# Patient Record
Sex: Male | Born: 1970
Health system: Southern US, Community
[De-identification: ages and names within clinical notes are randomized; demographics above are authoritative.]

## PROBLEM LIST (undated history)

## (undated) DIAGNOSIS — T7840XA Allergy, unspecified, initial encounter: Secondary | ICD-10-CM

## (undated) DIAGNOSIS — K219 Gastro-esophageal reflux disease without esophagitis: Secondary | ICD-10-CM

## (undated) DIAGNOSIS — J45909 Unspecified asthma, uncomplicated: Secondary | ICD-10-CM

## (undated) DIAGNOSIS — G43909 Migraine, unspecified, not intractable, without status migrainosus: Secondary | ICD-10-CM

## (undated) DIAGNOSIS — F419 Anxiety disorder, unspecified: Secondary | ICD-10-CM

## (undated) DIAGNOSIS — G4730 Sleep apnea, unspecified: Secondary | ICD-10-CM

## (undated) DIAGNOSIS — R519 Headache, unspecified: Secondary | ICD-10-CM

## (undated) HISTORY — DX: Allergy, unspecified, initial encounter: T78.40XA

## (undated) HISTORY — DX: Unspecified asthma, uncomplicated: J45.909

## (undated) HISTORY — DX: Gastro-esophageal reflux disease without esophagitis: K21.9

## (undated) HISTORY — DX: Headache, unspecified: R51.9

## (undated) HISTORY — DX: Anxiety disorder, unspecified: F41.9

## (undated) HISTORY — DX: Migraine, unspecified, not intractable, without status migrainosus: G43.909

## (undated) HISTORY — DX: Sleep apnea, unspecified: G47.30

---

## 1999-09-14 HISTORY — PX: WISDOM TOOTH EXTRACTION: SHX21

## 2001-09-13 HISTORY — PX: VEIN SURGERY: SHX48

## 2002-05-08 ENCOUNTER — Ambulatory Visit (HOSPITAL_COMMUNITY): Admission: RE | Admit: 2002-05-08 | Discharge: 2002-05-08 | Payer: Self-pay | Admitting: Internal Medicine

## 2002-12-17 ENCOUNTER — Encounter: Payer: Self-pay | Admitting: Internal Medicine

## 2002-12-17 ENCOUNTER — Encounter: Admission: RE | Admit: 2002-12-17 | Discharge: 2002-12-17 | Payer: Self-pay | Admitting: Internal Medicine

## 2004-06-18 ENCOUNTER — Encounter: Admission: RE | Admit: 2004-06-18 | Discharge: 2004-06-18 | Payer: Self-pay | Admitting: Diagnostic Radiology

## 2004-10-08 ENCOUNTER — Encounter: Admission: RE | Admit: 2004-10-08 | Discharge: 2004-10-08 | Payer: Self-pay | Admitting: Internal Medicine

## 2004-10-13 ENCOUNTER — Encounter: Admission: RE | Admit: 2004-10-13 | Discharge: 2004-10-13 | Payer: Self-pay | Admitting: Internal Medicine

## 2004-11-10 ENCOUNTER — Encounter: Admission: RE | Admit: 2004-11-10 | Discharge: 2004-11-10 | Payer: Self-pay | Admitting: Internal Medicine

## 2005-04-29 ENCOUNTER — Encounter: Admission: RE | Admit: 2005-04-29 | Discharge: 2005-04-29 | Payer: Self-pay | Admitting: Internal Medicine

## 2006-08-16 ENCOUNTER — Encounter: Admission: RE | Admit: 2006-08-16 | Discharge: 2006-08-16 | Payer: Self-pay | Admitting: Internal Medicine

## 2008-02-27 ENCOUNTER — Ambulatory Visit (HOSPITAL_COMMUNITY): Admission: RE | Admit: 2008-02-27 | Discharge: 2008-02-27 | Payer: Self-pay | Admitting: Internal Medicine

## 2010-09-13 HISTORY — PX: OTHER SURGICAL HISTORY: SHX169

## 2010-10-04 ENCOUNTER — Encounter: Payer: Self-pay | Admitting: Interventional Radiology

## 2010-12-25 ENCOUNTER — Ambulatory Visit: Payer: PRIVATE HEALTH INSURANCE | Attending: Specialist

## 2010-12-25 DIAGNOSIS — M25619 Stiffness of unspecified shoulder, not elsewhere classified: Secondary | ICD-10-CM | POA: Insufficient documentation

## 2010-12-25 DIAGNOSIS — M25519 Pain in unspecified shoulder: Secondary | ICD-10-CM | POA: Insufficient documentation

## 2010-12-25 DIAGNOSIS — IMO0001 Reserved for inherently not codable concepts without codable children: Secondary | ICD-10-CM | POA: Insufficient documentation

## 2010-12-25 DIAGNOSIS — R5381 Other malaise: Secondary | ICD-10-CM | POA: Insufficient documentation

## 2010-12-30 ENCOUNTER — Ambulatory Visit: Payer: PRIVATE HEALTH INSURANCE | Admitting: Physical Therapy

## 2010-12-31 ENCOUNTER — Ambulatory Visit: Payer: PRIVATE HEALTH INSURANCE | Admitting: Physical Therapy

## 2011-01-05 ENCOUNTER — Encounter: Payer: PRIVATE HEALTH INSURANCE | Admitting: Physical Therapy

## 2011-01-07 ENCOUNTER — Ambulatory Visit: Payer: PRIVATE HEALTH INSURANCE | Admitting: Physical Therapy

## 2011-01-11 ENCOUNTER — Ambulatory Visit: Payer: PRIVATE HEALTH INSURANCE | Admitting: Physical Therapy

## 2011-01-15 ENCOUNTER — Ambulatory Visit: Payer: PRIVATE HEALTH INSURANCE | Attending: Specialist

## 2011-01-15 DIAGNOSIS — R5381 Other malaise: Secondary | ICD-10-CM | POA: Insufficient documentation

## 2011-01-15 DIAGNOSIS — IMO0001 Reserved for inherently not codable concepts without codable children: Secondary | ICD-10-CM | POA: Insufficient documentation

## 2011-01-15 DIAGNOSIS — M25619 Stiffness of unspecified shoulder, not elsewhere classified: Secondary | ICD-10-CM | POA: Insufficient documentation

## 2011-01-15 DIAGNOSIS — M25519 Pain in unspecified shoulder: Secondary | ICD-10-CM | POA: Insufficient documentation

## 2011-01-21 ENCOUNTER — Ambulatory Visit: Payer: PRIVATE HEALTH INSURANCE

## 2011-02-02 ENCOUNTER — Ambulatory Visit: Payer: PRIVATE HEALTH INSURANCE

## 2011-02-09 ENCOUNTER — Ambulatory Visit: Payer: PRIVATE HEALTH INSURANCE

## 2011-02-15 ENCOUNTER — Ambulatory Visit: Payer: PRIVATE HEALTH INSURANCE | Attending: Specialist | Admitting: Physical Therapy

## 2011-02-15 DIAGNOSIS — IMO0001 Reserved for inherently not codable concepts without codable children: Secondary | ICD-10-CM | POA: Insufficient documentation

## 2011-02-15 DIAGNOSIS — M25619 Stiffness of unspecified shoulder, not elsewhere classified: Secondary | ICD-10-CM | POA: Insufficient documentation

## 2011-02-15 DIAGNOSIS — R5381 Other malaise: Secondary | ICD-10-CM | POA: Insufficient documentation

## 2011-02-15 DIAGNOSIS — M25519 Pain in unspecified shoulder: Secondary | ICD-10-CM | POA: Insufficient documentation

## 2011-02-23 ENCOUNTER — Ambulatory Visit: Payer: PRIVATE HEALTH INSURANCE

## 2011-03-05 ENCOUNTER — Ambulatory Visit: Payer: PRIVATE HEALTH INSURANCE

## 2011-03-18 ENCOUNTER — Ambulatory Visit: Payer: PRIVATE HEALTH INSURANCE | Attending: Specialist

## 2011-03-18 DIAGNOSIS — R5381 Other malaise: Secondary | ICD-10-CM | POA: Insufficient documentation

## 2011-03-18 DIAGNOSIS — IMO0001 Reserved for inherently not codable concepts without codable children: Secondary | ICD-10-CM | POA: Insufficient documentation

## 2011-03-18 DIAGNOSIS — M25619 Stiffness of unspecified shoulder, not elsewhere classified: Secondary | ICD-10-CM | POA: Insufficient documentation

## 2011-03-18 DIAGNOSIS — M25519 Pain in unspecified shoulder: Secondary | ICD-10-CM | POA: Insufficient documentation

## 2011-04-14 ENCOUNTER — Other Ambulatory Visit: Payer: Self-pay | Admitting: Dermatology

## 2011-05-21 ENCOUNTER — Ambulatory Visit: Payer: PRIVATE HEALTH INSURANCE

## 2011-05-27 ENCOUNTER — Ambulatory Visit: Payer: PRIVATE HEALTH INSURANCE | Attending: Specialist

## 2011-05-27 DIAGNOSIS — IMO0001 Reserved for inherently not codable concepts without codable children: Secondary | ICD-10-CM | POA: Insufficient documentation

## 2011-05-27 DIAGNOSIS — M25519 Pain in unspecified shoulder: Secondary | ICD-10-CM | POA: Insufficient documentation

## 2011-05-27 DIAGNOSIS — M25619 Stiffness of unspecified shoulder, not elsewhere classified: Secondary | ICD-10-CM | POA: Insufficient documentation

## 2011-05-27 DIAGNOSIS — R5381 Other malaise: Secondary | ICD-10-CM | POA: Insufficient documentation

## 2011-06-10 ENCOUNTER — Ambulatory Visit: Payer: PRIVATE HEALTH INSURANCE

## 2011-06-22 ENCOUNTER — Ambulatory Visit: Payer: BC Managed Care – PPO | Attending: Specialist

## 2011-06-22 DIAGNOSIS — R5381 Other malaise: Secondary | ICD-10-CM | POA: Insufficient documentation

## 2011-06-22 DIAGNOSIS — M25519 Pain in unspecified shoulder: Secondary | ICD-10-CM | POA: Insufficient documentation

## 2011-06-22 DIAGNOSIS — IMO0001 Reserved for inherently not codable concepts without codable children: Secondary | ICD-10-CM | POA: Insufficient documentation

## 2011-06-22 DIAGNOSIS — M25619 Stiffness of unspecified shoulder, not elsewhere classified: Secondary | ICD-10-CM | POA: Insufficient documentation

## 2011-07-09 ENCOUNTER — Ambulatory Visit: Payer: BC Managed Care – PPO

## 2011-07-30 ENCOUNTER — Ambulatory Visit: Payer: PRIVATE HEALTH INSURANCE | Attending: Specialist

## 2011-07-30 DIAGNOSIS — R5381 Other malaise: Secondary | ICD-10-CM | POA: Insufficient documentation

## 2011-07-30 DIAGNOSIS — IMO0001 Reserved for inherently not codable concepts without codable children: Secondary | ICD-10-CM | POA: Insufficient documentation

## 2011-07-30 DIAGNOSIS — M25619 Stiffness of unspecified shoulder, not elsewhere classified: Secondary | ICD-10-CM | POA: Insufficient documentation

## 2011-07-30 DIAGNOSIS — M25519 Pain in unspecified shoulder: Secondary | ICD-10-CM | POA: Insufficient documentation

## 2015-09-30 MED FILL — PANTOPRAZOLE SOD DR 40 MG T: 40 | 90 days supply | Qty: 90 | Fill #3

## 2015-09-30 MED FILL — METOPROLOL TARTRATE 25 MG T: 25 | 90 days supply | Qty: 180 | Fill #0

## 2015-10-28 MED FILL — CITALOPRAM HBR 40 MG TABLET: 40 | 90 days supply | Qty: 90 | Fill #2

## 2015-12-31 MED FILL — PANTOPRAZOLE SOD DR 40 MG T: 40 | 90 days supply | Qty: 90 | Fill #0

## 2016-02-04 MED FILL — CITALOPRAM HBR 40 MG TABLET: 40 | 90 days supply | Qty: 90 | Fill #3

## 2016-03-23 MED FILL — PANTOPRAZOLE SOD DR 40 MG T: 40 | 90 days supply | Qty: 90 | Fill #1

## 2016-03-23 MED FILL — METOPROLOL TARTRATE 25 MG T: 25 | 90 days supply | Qty: 180 | Fill #1

## 2016-03-25 DIAGNOSIS — H5213 Myopia, bilateral: Secondary | ICD-10-CM | POA: Diagnosis not present

## 2016-03-25 DIAGNOSIS — H21231 Degeneration of iris (pigmentary), right eye: Secondary | ICD-10-CM | POA: Diagnosis not present

## 2016-03-30 DIAGNOSIS — J3089 Other allergic rhinitis: Secondary | ICD-10-CM | POA: Diagnosis not present

## 2016-04-08 MED FILL — FLUTICASONE PROP 50 MCG SPR: 50 | 90 days supply | Qty: 48 | Fill #1

## 2016-04-27 MED FILL — AZITHROMYCIN 250 MG TABLET: 250 | 5 days supply | Qty: 6 | Fill #0

## 2016-05-06 MED FILL — CITALOPRAM HBR 40 MG TABLET: 40 | 90 days supply | Qty: 90 | Fill #0

## 2016-07-05 MED FILL — PANTOPRAZOLE SOD DR 40 MG T: 40 | 90 days supply | Qty: 90 | Fill #2

## 2016-07-05 MED FILL — FLUTICASONE PROP 50 MCG SPR: 50 | 90 days supply | Qty: 48 | Fill #2

## 2016-08-02 MED FILL — CITALOPRAM HBR 40 MG TABLET: 40 | 90 days supply | Qty: 90 | Fill #1

## 2016-08-12 DIAGNOSIS — E785 Hyperlipidemia, unspecified: Secondary | ICD-10-CM | POA: Diagnosis not present

## 2016-08-12 DIAGNOSIS — Z125 Encounter for screening for malignant neoplasm of prostate: Secondary | ICD-10-CM | POA: Diagnosis not present

## 2016-08-12 DIAGNOSIS — R7301 Impaired fasting glucose: Secondary | ICD-10-CM | POA: Diagnosis not present

## 2016-08-12 DIAGNOSIS — Z0000 Encounter for general adult medical examination without abnormal findings: Secondary | ICD-10-CM | POA: Diagnosis not present

## 2016-08-18 DIAGNOSIS — Z0000 Encounter for general adult medical examination without abnormal findings: Secondary | ICD-10-CM | POA: Diagnosis not present

## 2016-08-18 DIAGNOSIS — Z1389 Encounter for screening for other disorder: Secondary | ICD-10-CM | POA: Diagnosis not present

## 2016-08-18 DIAGNOSIS — Z6826 Body mass index (BMI) 26.0-26.9, adult: Secondary | ICD-10-CM | POA: Diagnosis not present

## 2016-08-18 DIAGNOSIS — J302 Other seasonal allergic rhinitis: Secondary | ICD-10-CM | POA: Diagnosis not present

## 2016-08-18 DIAGNOSIS — K219 Gastro-esophageal reflux disease without esophagitis: Secondary | ICD-10-CM | POA: Diagnosis not present

## 2016-08-18 DIAGNOSIS — J45998 Other asthma: Secondary | ICD-10-CM | POA: Diagnosis not present

## 2016-08-18 DIAGNOSIS — M25511 Pain in right shoulder: Secondary | ICD-10-CM | POA: Diagnosis not present

## 2016-08-18 MED FILL — ATORVASTATIN 10 MG TABLET: 10 | 90 days supply | Qty: 90 | Fill #0

## 2016-09-30 MED FILL — METOPROLOL TARTRATE 25 MG T: 25 | 90 days supply | Qty: 180 | Fill #0

## 2016-09-30 MED FILL — PANTOPRAZOLE SOD DR 40 MG T: 40 | 90 days supply | Qty: 90 | Fill #3

## 2016-10-04 MED FILL — AZITHROMYCIN 500 MG TABLET: 500 | 3 days supply | Qty: 3 | Fill #0

## 2016-10-18 DIAGNOSIS — J3089 Other allergic rhinitis: Secondary | ICD-10-CM | POA: Diagnosis not present

## 2016-11-09 MED FILL — CITALOPRAM HBR 40 MG TABLET: 40 | 90 days supply | Qty: 90 | Fill #2

## 2016-12-27 MED FILL — FLUTICASONE PROP 50 MCG SPR: 50 | 90 days supply | Qty: 48 | Fill #0

## 2017-01-28 MED FILL — NYSTATIN 100,000 UNITS/GM O: 100000 | 10 days supply | Qty: 15 | Fill #0

## 2017-02-11 MED FILL — CITALOPRAM HBR 40 MG TABLET: 40 | 90 days supply | Qty: 90 | Fill #3

## 2017-03-15 MED FILL — METOPROLOL TARTRATE 25 MG T: 25 | 90 days supply | Qty: 180 | Fill #1

## 2017-03-21 DIAGNOSIS — J3089 Other allergic rhinitis: Secondary | ICD-10-CM | POA: Diagnosis not present

## 2017-05-02 MED FILL — CITALOPRAM HBR 40 MG TABLET: 40 | 90 days supply | Qty: 90 | Fill #0

## 2017-05-13 MED FILL — FLUTICASONE PROP 50 MCG SPR: 50 | 90 days supply | Qty: 48 | Fill #1

## 2017-05-23 MED FILL — PANTOPRAZOLE SOD DR 40 MG T: 40 | 90 days supply | Qty: 90 | Fill #0

## 2017-08-15 DIAGNOSIS — J4530 Mild persistent asthma, uncomplicated: Secondary | ICD-10-CM | POA: Diagnosis not present

## 2017-08-15 DIAGNOSIS — J3089 Other allergic rhinitis: Secondary | ICD-10-CM | POA: Diagnosis not present

## 2017-08-15 MED FILL — FLUTICASONE PROP 50 MCG SPR: 50 | 90 days supply | Qty: 48 | Fill #0

## 2017-08-19 MED FILL — PANTOPRAZOLE SOD DR 40 MG T: 40 | 90 days supply | Qty: 90 | Fill #1

## 2017-08-19 MED FILL — CITALOPRAM HBR 40 MG TABLET: 40 | 90 days supply | Qty: 90 | Fill #1

## 2017-08-31 DIAGNOSIS — J3089 Other allergic rhinitis: Secondary | ICD-10-CM | POA: Diagnosis not present

## 2017-09-08 DIAGNOSIS — R05 Cough: Secondary | ICD-10-CM | POA: Diagnosis not present

## 2017-09-08 DIAGNOSIS — J209 Acute bronchitis, unspecified: Secondary | ICD-10-CM | POA: Diagnosis not present

## 2017-09-08 DIAGNOSIS — R509 Fever, unspecified: Secondary | ICD-10-CM | POA: Diagnosis not present

## 2017-09-08 DIAGNOSIS — J45909 Unspecified asthma, uncomplicated: Secondary | ICD-10-CM | POA: Diagnosis not present

## 2017-09-09 MED FILL — predniSONE 10 MG TABS: 10 | 6 days supply | Qty: 21 | Fill #0

## 2017-09-09 MED FILL — AZITHROMYCIN 250 MG TABLET: 250 | 5 days supply | Qty: 6 | Fill #0

## 2017-10-07 MED FILL — METOPROLOL TARTRATE 25 MG T: 25 | 90 days supply | Qty: 180 | Fill #0

## 2017-10-11 DIAGNOSIS — Z0000 Encounter for general adult medical examination without abnormal findings: Secondary | ICD-10-CM | POA: Diagnosis not present

## 2017-10-11 DIAGNOSIS — Z125 Encounter for screening for malignant neoplasm of prostate: Secondary | ICD-10-CM | POA: Diagnosis not present

## 2017-10-17 DIAGNOSIS — J45998 Other asthma: Secondary | ICD-10-CM | POA: Diagnosis not present

## 2017-10-17 DIAGNOSIS — Z1389 Encounter for screening for other disorder: Secondary | ICD-10-CM | POA: Diagnosis not present

## 2017-10-17 DIAGNOSIS — Z0000 Encounter for general adult medical examination without abnormal findings: Secondary | ICD-10-CM | POA: Diagnosis not present

## 2017-10-17 DIAGNOSIS — J302 Other seasonal allergic rhinitis: Secondary | ICD-10-CM | POA: Diagnosis not present

## 2017-10-17 DIAGNOSIS — M25519 Pain in unspecified shoulder: Secondary | ICD-10-CM | POA: Diagnosis not present

## 2017-10-17 DIAGNOSIS — Z1331 Encounter for screening for depression: Secondary | ICD-10-CM | POA: Diagnosis not present

## 2017-10-17 DIAGNOSIS — R0789 Other chest pain: Secondary | ICD-10-CM | POA: Diagnosis not present

## 2017-10-17 DIAGNOSIS — R7301 Impaired fasting glucose: Secondary | ICD-10-CM | POA: Diagnosis not present

## 2017-11-11 MED FILL — CITALOPRAM HBR 40 MG TABLET: 40 | 90 days supply | Qty: 90 | Fill #2

## 2017-11-11 MED FILL — PANTOPRAZOLE SOD DR 40 MG T: 40 | 90 days supply | Qty: 90 | Fill #2

## 2018-01-20 MED FILL — FLUTICASONE PROP 50 MCG SPR: 50 | 90 days supply | Qty: 48 | Fill #1

## 2018-02-21 MED FILL — CITALOPRAM HBR 40 MG TABLET: 40 | 90 days supply | Qty: 90 | Fill #3

## 2018-02-22 MED FILL — PANTOPRAZOLE SOD DR 40 MG T: 40 | 90 days supply | Qty: 90 | Fill #0

## 2018-04-05 MED FILL — METOPROLOL TARTRATE 25 MG T: 25 | 90 days supply | Qty: 180 | Fill #1

## 2018-04-20 DIAGNOSIS — M9901 Segmental and somatic dysfunction of cervical region: Secondary | ICD-10-CM | POA: Diagnosis not present

## 2018-04-20 DIAGNOSIS — M9902 Segmental and somatic dysfunction of thoracic region: Secondary | ICD-10-CM | POA: Diagnosis not present

## 2018-04-20 DIAGNOSIS — M47816 Spondylosis without myelopathy or radiculopathy, lumbar region: Secondary | ICD-10-CM | POA: Diagnosis not present

## 2018-04-20 DIAGNOSIS — M9903 Segmental and somatic dysfunction of lumbar region: Secondary | ICD-10-CM | POA: Diagnosis not present

## 2018-04-24 DIAGNOSIS — M9901 Segmental and somatic dysfunction of cervical region: Secondary | ICD-10-CM | POA: Diagnosis not present

## 2018-04-24 DIAGNOSIS — M9902 Segmental and somatic dysfunction of thoracic region: Secondary | ICD-10-CM | POA: Diagnosis not present

## 2018-04-24 DIAGNOSIS — M47816 Spondylosis without myelopathy or radiculopathy, lumbar region: Secondary | ICD-10-CM | POA: Diagnosis not present

## 2018-04-24 DIAGNOSIS — M9903 Segmental and somatic dysfunction of lumbar region: Secondary | ICD-10-CM | POA: Diagnosis not present

## 2018-04-27 DIAGNOSIS — M9903 Segmental and somatic dysfunction of lumbar region: Secondary | ICD-10-CM | POA: Diagnosis not present

## 2018-04-27 DIAGNOSIS — M9902 Segmental and somatic dysfunction of thoracic region: Secondary | ICD-10-CM | POA: Diagnosis not present

## 2018-04-27 DIAGNOSIS — M9901 Segmental and somatic dysfunction of cervical region: Secondary | ICD-10-CM | POA: Diagnosis not present

## 2018-04-27 DIAGNOSIS — M47816 Spondylosis without myelopathy or radiculopathy, lumbar region: Secondary | ICD-10-CM | POA: Diagnosis not present

## 2018-05-01 DIAGNOSIS — M47816 Spondylosis without myelopathy or radiculopathy, lumbar region: Secondary | ICD-10-CM | POA: Diagnosis not present

## 2018-05-01 DIAGNOSIS — M9903 Segmental and somatic dysfunction of lumbar region: Secondary | ICD-10-CM | POA: Diagnosis not present

## 2018-05-01 DIAGNOSIS — M9902 Segmental and somatic dysfunction of thoracic region: Secondary | ICD-10-CM | POA: Diagnosis not present

## 2018-05-01 DIAGNOSIS — M9901 Segmental and somatic dysfunction of cervical region: Secondary | ICD-10-CM | POA: Diagnosis not present

## 2018-05-04 DIAGNOSIS — M9901 Segmental and somatic dysfunction of cervical region: Secondary | ICD-10-CM | POA: Diagnosis not present

## 2018-05-04 DIAGNOSIS — M47816 Spondylosis without myelopathy or radiculopathy, lumbar region: Secondary | ICD-10-CM | POA: Diagnosis not present

## 2018-05-04 DIAGNOSIS — M9902 Segmental and somatic dysfunction of thoracic region: Secondary | ICD-10-CM | POA: Diagnosis not present

## 2018-05-04 DIAGNOSIS — M9903 Segmental and somatic dysfunction of lumbar region: Secondary | ICD-10-CM | POA: Diagnosis not present

## 2018-05-05 MED FILL — FLUTICASONE PROP 50 MCG SPR: 50 | 90 days supply | Qty: 48 | Fill #2

## 2018-05-09 DIAGNOSIS — M9903 Segmental and somatic dysfunction of lumbar region: Secondary | ICD-10-CM | POA: Diagnosis not present

## 2018-05-09 DIAGNOSIS — M9901 Segmental and somatic dysfunction of cervical region: Secondary | ICD-10-CM | POA: Diagnosis not present

## 2018-05-09 DIAGNOSIS — M9902 Segmental and somatic dysfunction of thoracic region: Secondary | ICD-10-CM | POA: Diagnosis not present

## 2018-05-09 DIAGNOSIS — M47816 Spondylosis without myelopathy or radiculopathy, lumbar region: Secondary | ICD-10-CM | POA: Diagnosis not present

## 2018-05-10 DIAGNOSIS — M9902 Segmental and somatic dysfunction of thoracic region: Secondary | ICD-10-CM | POA: Diagnosis not present

## 2018-05-10 DIAGNOSIS — M9901 Segmental and somatic dysfunction of cervical region: Secondary | ICD-10-CM | POA: Diagnosis not present

## 2018-05-10 DIAGNOSIS — M47816 Spondylosis without myelopathy or radiculopathy, lumbar region: Secondary | ICD-10-CM | POA: Diagnosis not present

## 2018-05-10 DIAGNOSIS — M9903 Segmental and somatic dysfunction of lumbar region: Secondary | ICD-10-CM | POA: Diagnosis not present

## 2018-05-11 DIAGNOSIS — J3089 Other allergic rhinitis: Secondary | ICD-10-CM | POA: Diagnosis not present

## 2018-05-16 DIAGNOSIS — M47816 Spondylosis without myelopathy or radiculopathy, lumbar region: Secondary | ICD-10-CM | POA: Diagnosis not present

## 2018-05-16 DIAGNOSIS — M9902 Segmental and somatic dysfunction of thoracic region: Secondary | ICD-10-CM | POA: Diagnosis not present

## 2018-05-16 DIAGNOSIS — M9903 Segmental and somatic dysfunction of lumbar region: Secondary | ICD-10-CM | POA: Diagnosis not present

## 2018-05-16 DIAGNOSIS — M9901 Segmental and somatic dysfunction of cervical region: Secondary | ICD-10-CM | POA: Diagnosis not present

## 2018-05-18 DIAGNOSIS — M47816 Spondylosis without myelopathy or radiculopathy, lumbar region: Secondary | ICD-10-CM | POA: Diagnosis not present

## 2018-05-18 DIAGNOSIS — M9903 Segmental and somatic dysfunction of lumbar region: Secondary | ICD-10-CM | POA: Diagnosis not present

## 2018-05-18 DIAGNOSIS — M9902 Segmental and somatic dysfunction of thoracic region: Secondary | ICD-10-CM | POA: Diagnosis not present

## 2018-05-18 DIAGNOSIS — M9901 Segmental and somatic dysfunction of cervical region: Secondary | ICD-10-CM | POA: Diagnosis not present

## 2018-05-22 DIAGNOSIS — M9901 Segmental and somatic dysfunction of cervical region: Secondary | ICD-10-CM | POA: Diagnosis not present

## 2018-05-22 DIAGNOSIS — M9903 Segmental and somatic dysfunction of lumbar region: Secondary | ICD-10-CM | POA: Diagnosis not present

## 2018-05-22 DIAGNOSIS — M9902 Segmental and somatic dysfunction of thoracic region: Secondary | ICD-10-CM | POA: Diagnosis not present

## 2018-05-22 DIAGNOSIS — M47816 Spondylosis without myelopathy or radiculopathy, lumbar region: Secondary | ICD-10-CM | POA: Diagnosis not present

## 2018-05-25 DIAGNOSIS — M9902 Segmental and somatic dysfunction of thoracic region: Secondary | ICD-10-CM | POA: Diagnosis not present

## 2018-05-25 DIAGNOSIS — M9903 Segmental and somatic dysfunction of lumbar region: Secondary | ICD-10-CM | POA: Diagnosis not present

## 2018-05-25 DIAGNOSIS — M9901 Segmental and somatic dysfunction of cervical region: Secondary | ICD-10-CM | POA: Diagnosis not present

## 2018-05-25 DIAGNOSIS — M47816 Spondylosis without myelopathy or radiculopathy, lumbar region: Secondary | ICD-10-CM | POA: Diagnosis not present

## 2018-05-26 MED FILL — PANTOPRAZOLE SOD DR 40 MG T: 40 | 90 days supply | Qty: 90 | Fill #1

## 2018-05-29 DIAGNOSIS — M9903 Segmental and somatic dysfunction of lumbar region: Secondary | ICD-10-CM | POA: Diagnosis not present

## 2018-05-29 DIAGNOSIS — M9901 Segmental and somatic dysfunction of cervical region: Secondary | ICD-10-CM | POA: Diagnosis not present

## 2018-05-29 DIAGNOSIS — M47816 Spondylosis without myelopathy or radiculopathy, lumbar region: Secondary | ICD-10-CM | POA: Diagnosis not present

## 2018-05-29 DIAGNOSIS — M9902 Segmental and somatic dysfunction of thoracic region: Secondary | ICD-10-CM | POA: Diagnosis not present

## 2018-05-29 MED FILL — CITALOPRAM HBR 40 MG TABLET: 40 | 90 days supply | Qty: 90 | Fill #0

## 2018-05-30 DIAGNOSIS — M47816 Spondylosis without myelopathy or radiculopathy, lumbar region: Secondary | ICD-10-CM | POA: Diagnosis not present

## 2018-05-30 DIAGNOSIS — M9902 Segmental and somatic dysfunction of thoracic region: Secondary | ICD-10-CM | POA: Diagnosis not present

## 2018-05-30 DIAGNOSIS — M9901 Segmental and somatic dysfunction of cervical region: Secondary | ICD-10-CM | POA: Diagnosis not present

## 2018-05-30 DIAGNOSIS — M9903 Segmental and somatic dysfunction of lumbar region: Secondary | ICD-10-CM | POA: Diagnosis not present

## 2018-06-05 DIAGNOSIS — M9902 Segmental and somatic dysfunction of thoracic region: Secondary | ICD-10-CM | POA: Diagnosis not present

## 2018-06-05 DIAGNOSIS — M9901 Segmental and somatic dysfunction of cervical region: Secondary | ICD-10-CM | POA: Diagnosis not present

## 2018-06-05 DIAGNOSIS — M47816 Spondylosis without myelopathy or radiculopathy, lumbar region: Secondary | ICD-10-CM | POA: Diagnosis not present

## 2018-06-05 DIAGNOSIS — M9903 Segmental and somatic dysfunction of lumbar region: Secondary | ICD-10-CM | POA: Diagnosis not present

## 2018-08-21 DIAGNOSIS — J4530 Mild persistent asthma, uncomplicated: Secondary | ICD-10-CM | POA: Diagnosis not present

## 2018-08-21 DIAGNOSIS — J3089 Other allergic rhinitis: Secondary | ICD-10-CM | POA: Diagnosis not present

## 2018-09-01 MED FILL — FLUTICASONE PROP 50 MCG SPR: 50 | 30 days supply | Qty: 16 | Fill #0

## 2018-09-01 MED FILL — PANTOPRAZOLE SOD DR 40 MG T: 40 | 30 days supply | Qty: 30 | Fill #2

## 2018-09-01 MED FILL — CITALOPRAM HBR 40 MG TABLET: 40 | 30 days supply | Qty: 30 | Fill #1

## 2018-10-04 MED FILL — CITALOPRAM HBR 40 MG TABLET: 40 | 30 days supply | Qty: 30 | Fill #2

## 2018-10-04 MED FILL — FLUTICASONE PROP 50 MCG SPR: 50 | 30 days supply | Qty: 16 | Fill #1

## 2018-10-04 MED FILL — PANTOPRAZOLE SOD DR 40 MG T: 40 | 30 days supply | Qty: 30 | Fill #3

## 2018-10-23 MED FILL — METOPROLOL TARTRATE 25 MG T: 25 | 30 days supply | Qty: 60 | Fill #0

## 2018-11-03 MED FILL — CITALOPRAM HBR 40 MG TABLET: 40 | 30 days supply | Qty: 30 | Fill #3

## 2018-11-03 MED FILL — PANTOPRAZOLE SOD DR 40 MG T: 40 | 30 days supply | Qty: 30 | Fill #4

## 2018-11-06 MED FILL — FLUTICASONE PROP 50 MCG SPR: 50 | 30 days supply | Qty: 16 | Fill #2

## 2018-11-20 DIAGNOSIS — E785 Hyperlipidemia, unspecified: Secondary | ICD-10-CM | POA: Diagnosis not present

## 2018-11-20 DIAGNOSIS — R82998 Other abnormal findings in urine: Secondary | ICD-10-CM | POA: Diagnosis not present

## 2018-11-20 DIAGNOSIS — Z0000 Encounter for general adult medical examination without abnormal findings: Secondary | ICD-10-CM | POA: Diagnosis not present

## 2018-11-27 DIAGNOSIS — J45998 Other asthma: Secondary | ICD-10-CM | POA: Diagnosis not present

## 2018-11-27 DIAGNOSIS — Z0000 Encounter for general adult medical examination without abnormal findings: Secondary | ICD-10-CM | POA: Diagnosis not present

## 2018-11-27 DIAGNOSIS — H6123 Impacted cerumen, bilateral: Secondary | ICD-10-CM | POA: Diagnosis not present

## 2018-11-27 DIAGNOSIS — M25519 Pain in unspecified shoulder: Secondary | ICD-10-CM | POA: Diagnosis not present

## 2018-11-27 DIAGNOSIS — J3089 Other allergic rhinitis: Secondary | ICD-10-CM | POA: Diagnosis not present

## 2018-11-27 MED FILL — METOPROLOL TARTRATE 25 MG T: 25 | 30 days supply | Qty: 60 | Fill #1 | Status: TO

## 2018-11-27 MED FILL — PANTOPRAZOLE SOD DR 40 MG T: 40 | 30 days supply | Qty: 30 | Fill #0 | Status: TO

## 2018-11-27 MED FILL — CITALOPRAM HBR 40 MG TABLET: 40 | 30 days supply | Qty: 30 | Fill #4 | Status: TO

## 2018-11-29 MED FILL — VENTOLIN HFA 90 MCG INHALER: 108 (90 BAS | 31 days supply | Qty: 18 | Fill #0

## 2018-11-29 MED FILL — FLUTICASONE PROP 50 MCG SPR: 50 | 30 days supply | Qty: 16 | Fill #3

## 2018-12-25 MED FILL — METOPROLOL TARTRATE 25 MG T: 25 | 30 days supply | Qty: 60 | Fill #0

## 2018-12-25 MED FILL — CITALOPRAM HBR 40 MG TABLET: 40 | 30 days supply | Qty: 30 | Fill #0

## 2018-12-25 MED FILL — PANTOPRAZOLE SOD DR 40 MG T: 40 | 30 days supply | Qty: 30 | Fill #0

## 2019-01-29 MED FILL — CITALOPRAM HBR 40 MG TABLET: 40 | 30 days supply | Qty: 30 | Fill #1

## 2019-01-29 MED FILL — PANTOPRAZOLE SOD DR 40 MG T: 40 | 30 days supply | Qty: 30 | Fill #1

## 2019-01-29 MED FILL — METOPROLOL TARTRATE 25 MG T: 25 | 30 days supply | Qty: 60 | Fill #1

## 2019-02-15 MED FILL — FLUTICASONE PROP 50 MCG SPR: 50 | 30 days supply | Qty: 16 | Fill #4

## 2019-03-02 MED FILL — PANTOPRAZOLE SOD DR 40 MG T: 40 | 30 days supply | Qty: 30 | Fill #0

## 2019-03-02 MED FILL — CITALOPRAM HBR 40 MG TABLET: 40 | 30 days supply | Qty: 30 | Fill #0

## 2019-03-02 MED FILL — METOPROLOL TARTRATE 25 MG T: 25 | 30 days supply | Qty: 60 | Fill #0

## 2019-04-02 MED FILL — CITALOPRAM HBR 40 MG TABLET: 40 | 30 days supply | Qty: 30 | Fill #1

## 2019-04-02 MED FILL — PANTOPRAZOLE SOD DR 40 MG T: 40 | 30 days supply | Qty: 30 | Fill #1

## 2019-04-02 MED FILL — METOPROLOL TARTRATE 25 MG T: 25 | 30 days supply | Qty: 60 | Fill #1

## 2019-05-10 MED FILL — CITALOPRAM HBR 40 MG TABLET: 40 | 30 days supply | Qty: 30 | Fill #2

## 2019-05-10 MED FILL — PANTOPRAZOLE SOD DR 40 MG T: 40 | 30 days supply | Qty: 30 | Fill #2

## 2019-06-13 MED FILL — PANTOPRAZOLE SOD DR 40 MG T: 40 | 30 days supply | Qty: 30 | Fill #3

## 2019-06-13 MED FILL — CITALOPRAM HBR 40 MG TABLET: 40 | 30 days supply | Qty: 30 | Fill #0

## 2019-07-09 MED FILL — PANTOPRAZOLE SOD DR 40 MG T: 40 | 30 days supply | Qty: 30 | Fill #4

## 2019-07-09 MED FILL — CITALOPRAM HBR 40 MG TABLET: 40 | 30 days supply | Qty: 30 | Fill #1

## 2019-08-08 MED FILL — PANTOPRAZOLE SOD DR 40 MG T: 40 | 30 days supply | Qty: 30 | Fill #5

## 2019-08-08 MED FILL — CITALOPRAM HBR 40 MG TABLET: 40 | 30 days supply | Qty: 30 | Fill #2

## 2019-08-29 DIAGNOSIS — J3089 Other allergic rhinitis: Secondary | ICD-10-CM | POA: Diagnosis not present

## 2019-08-29 DIAGNOSIS — J4530 Mild persistent asthma, uncomplicated: Secondary | ICD-10-CM | POA: Diagnosis not present

## 2019-08-29 DIAGNOSIS — H1045 Other chronic allergic conjunctivitis: Secondary | ICD-10-CM | POA: Diagnosis not present

## 2019-09-03 DIAGNOSIS — J3089 Other allergic rhinitis: Secondary | ICD-10-CM | POA: Diagnosis not present

## 2019-09-10 MED FILL — CITALOPRAM HBR 40 MG TABLET: 40 | 30 days supply | Qty: 30 | Fill #3

## 2019-09-10 MED FILL — PANTOPRAZOLE SOD DR 40 MG T: 40 | 30 days supply | Qty: 30 | Fill #6

## 2019-10-09 DIAGNOSIS — Z7689 Persons encountering health services in other specified circumstances: Secondary | ICD-10-CM | POA: Diagnosis not present

## 2019-10-10 MED FILL — CITALOPRAM HBR 40 MG TABLET: 40 | 30 days supply | Qty: 30 | Fill #4

## 2019-10-23 MED FILL — PANTOPRAZOLE SOD DR 40 MG T: 40 | 30 days supply | Qty: 30 | Fill #7

## 2019-10-25 MED FILL — METOPROLOL TARTRATE 25 MG T: 25 | 90 days supply | Qty: 180 | Fill #0

## 2019-11-09 MED FILL — CITALOPRAM HBR 40 MG TABLET: 40 | 30 days supply | Qty: 30 | Fill #5

## 2019-11-26 MED FILL — PANTOPRAZOLE SOD DR 40 MG T: 40 | 30 days supply | Qty: 30 | Fill #8

## 2019-12-11 MED FILL — CITALOPRAM HBR 40 MG TABLET: 40 | 30 days supply | Qty: 30 | Fill #6

## 2019-12-17 DIAGNOSIS — M47816 Spondylosis without myelopathy or radiculopathy, lumbar region: Secondary | ICD-10-CM | POA: Diagnosis not present

## 2019-12-17 DIAGNOSIS — M9903 Segmental and somatic dysfunction of lumbar region: Secondary | ICD-10-CM | POA: Diagnosis not present

## 2019-12-18 DIAGNOSIS — M47816 Spondylosis without myelopathy or radiculopathy, lumbar region: Secondary | ICD-10-CM | POA: Diagnosis not present

## 2019-12-18 DIAGNOSIS — M9903 Segmental and somatic dysfunction of lumbar region: Secondary | ICD-10-CM | POA: Diagnosis not present

## 2019-12-20 ENCOUNTER — Encounter: Payer: Self-pay | Admitting: Neurology

## 2019-12-24 ENCOUNTER — Encounter: Payer: Self-pay | Admitting: Neurology

## 2019-12-24 ENCOUNTER — Other Ambulatory Visit (HOSPITAL_COMMUNITY): Payer: Self-pay | Admitting: Internal Medicine

## 2019-12-24 ENCOUNTER — Ambulatory Visit (INDEPENDENT_AMBULATORY_CARE_PROVIDER_SITE_OTHER): Payer: BC Managed Care – PPO | Admitting: Neurology

## 2019-12-24 ENCOUNTER — Other Ambulatory Visit: Payer: Self-pay

## 2019-12-24 VITALS — BP 122/79 | HR 62 | Temp 97.1°F | Ht 74.0 in | Wt 204.0 lb

## 2019-12-24 DIAGNOSIS — Z87820 Personal history of traumatic brain injury: Secondary | ICD-10-CM | POA: Insufficient documentation

## 2019-12-24 DIAGNOSIS — G2581 Restless legs syndrome: Secondary | ICD-10-CM

## 2019-12-24 DIAGNOSIS — M47816 Spondylosis without myelopathy or radiculopathy, lumbar region: Secondary | ICD-10-CM | POA: Diagnosis not present

## 2019-12-24 DIAGNOSIS — G4730 Sleep apnea, unspecified: Secondary | ICD-10-CM

## 2019-12-24 DIAGNOSIS — G4710 Hypersomnia, unspecified: Secondary | ICD-10-CM

## 2019-12-24 DIAGNOSIS — M9903 Segmental and somatic dysfunction of lumbar region: Secondary | ICD-10-CM | POA: Diagnosis not present

## 2019-12-24 DIAGNOSIS — R0683 Snoring: Secondary | ICD-10-CM

## 2019-12-24 MED FILL — PANTOPRAZOLE SOD DR 40 MG T: 40 | 90 days supply | Qty: 90 | Fill #0

## 2019-12-24 NOTE — Patient Instructions (Signed)

## 2019-12-24 NOTE — Progress Notes (Signed)
SLEEP MEDICINE CLINIC    Provider:  Larey Seat, MD  Primary Care Physician:  [name], MD [place] [address]     Referring Provider: [name], [place],  [place] [address]          Chief Complaint according to patient   Patient presents with:    . 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 Chief concern according to patient : I just don't sleep enough and I snore.    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  He  has a past medical history of Allergies, Anxiety, Asthma, GERD (gastroesophageal reflux disease), Headache, and Migraine. Asthma is an allergic manifestation for him. GERD is an active problem.    Sleep relevant medical history: wisdom teeth removed.Family medical /sleep history: Mother and brother used CPAP for OSA. Social history: Patient is working as a Herbalist, [employer].  and lives in a household with wife and 2 children, son is freshman in college, daughter is 10.  The patient currently works and takes call as MD. Pets are present- one dog. Tobacco use: none .  ETOH use; 2-3 times a week,  Caffeine intake in form of Coffee( 1 in AM  Soda( none ) Tea ( none) nor energy drinks. Regular exercise in form of walking 3 miles a day, and playing tennis.     Sleep habits are as follows: The patient's dinner time is variable due to kid's sports, usually between 7-8 PM. The patient goes to bed at 11 PM and is asleep promptly , continues to sleep for several hours, wakes for one  bathroom break, but mostly for snoring and apnea- wife will wake him and tell him that he didn't breathe. The preferred sleep position is prone, with the support of 1-2 pillows.  Had a right shoulder injury and sleeps avoiding the right  shoulder. Dreams are reportedly frequent/vivid.  6.30- 7  AM is the usual rise time. The patient wakes up with an alarm. On weekends , he sleeps until 9 AM and he used to be able to sleep 12 hours.  He reports not feeling refreshed or restored in AM, with symptoms such as dry mouth , morning headaches sometimes, and residual fatigue.  Naps are taken on weekends frequently after lunch , lasting from 30-90 minutes and are more refreshing than nocturnal sleep.    Review of Systems: Out of a complete 14 system review, the patient complains of only the following symptoms, and all other reviewed systems are negative.:  Fatigue, sleepiness , snoring, fragmented sleep, Insomnia- when snoring.   How likely are you to doze in the following situations: 0 = not likely, 1 = slight chance, 2 = moderate chance, 3 = high chance   Sitting and Reading? 2 Watching Television? 1 Sitting inactive in a public place (theater or meeting)? 1 As a passenger in a car for an hour without a break? 2 Lying down in the afternoon when circumstances permit? 2 Sitting and talking to someone?0 Sitting quietly after lunch without alcohol? 3 In a car, while stopped for a few minutes in traffic?   Total = 11/ 24 points   FSS endorsed at 27/  63 points.   Social History   Socioeconomic History  . Marital status: Married    Spouse name: Not on file  . Number of children: 2  . Years of education: college  . Highest education level: Professional school degree (e.g., MD, DDS, DVM, JD)  Occupational History  . 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 Smoking status: Never Smoker  . Smokeless tobacco: Never Used  Substance and Sexual Activity  . Alcohol use: Yes    Comment: 1-2 drinks twice weekly  . Drug use: Never  . Sexual activity: Not on file  Other Topics Concern  . Not on file  Social History Narrative   Lives at home with wife and family.   Right-handed.   1 cup caffeine per day.   Social  Determinants of Health   Financial Resource Strain:   . Difficulty of Paying Living Expenses:   Food Insecurity:   . Worried About Charity fundraiser in the Last Year:   . Arboriculturist in the Last Year:   Transportation Needs:   . Film/video editor (Medical):   Marland Kitchen Lack of Transportation (Non-Medical):   Physical Activity:   . Days of Exercise per Week:   . Minutes of Exercise per Session:   Stress:   . Feeling of Stress :   Social Connections:   . Frequency of Communication with Friends and Family:   . Frequency of Social Gatherings with Friends and Family:   . Attends Religious Services:   . Active Member of Clubs or Organizations:   . Attends Archivist Meetings:   Marland Kitchen Marital Status:     Family History  Problem Relation Age of Onset  . Diabetes Mother   . Other Mother        Trigeminal Neuralgia, Meningioma (removal 1990s)  . Hypertension Mother   . Migraines Mother   . Sleep apnea Mother   . Crohn's disease Mother   . Asthma Mother   . Heart disease Father   . Asthma Brother   . Sleep apnea Brother   . Heart disease Paternal Grandfather   . Prostate cancer Paternal Grandfather     Past Medical History:  Diagnosis Date  . Allergies   . Anxiety   . Asthma   . GERD (gastroesophageal reflux disease)   . Headache   . Migraine     Past Surgical History:  Procedure Laterality Date  . shoulder surgery  2012  . VEIN SURGERY  2003  . WISDOM TOOTH EXTRACTION  2001     Current Outpatient Medications on File Prior to Visit  Medication Sig Dispense Refill  . citalopram (CELEXA) 40 MG tablet Take 40 mg by mouth daily.    . fluticasone (FLONASE) 50 MCG/ACT nasal spray Place 1 spray into both nostrils daily.    Marland Kitchen loratadine (CLARITIN) 10 MG tablet Take 10 mg by mouth daily.    Marland Kitchen MAGNESIUM PO Take 500 mg by mouth daily.    . metoprolol tartrate (LOPRESSOR) 25 MG tablet Take 25 mg by mouth 2 (two) times daily.    . Multiple Vitamin (MULTIVITAMIN)  tablet Take 1 tablet by mouth daily.    . pantoprazole (PROTONIX) 40 MG tablet Take 40 mg by mouth daily.    Marland Kitchen VITAMIN D PO Take 2,000 Units by mouth daily.     No current facility-administered medications on file prior to visit.    Allergies  Allergen Reactions  . Amoxicillin Hives  .  Cephalosporins Hives    Physical exam:  Today's Vitals   12/24/19 0944  BP: 122/79  Pulse: 62  Temp: (!) 97.1 F (36.2 C)  Weight: 204 lb (92.5 kg)  Height: 6\' 2"  (1.88 m)   Body mass index is 26.19 kg/m.   Wt Readings from Last 3 Encounters:  12/24/19 204 lb (92.5 kg)     Ht Readings from Last 3 Encounters:  12/24/19 6\' 2"  (1.88 m)      General: The patient is awake, alert and appears not in acute distress. The patient is well groomed. Head: Normocephalic, atraumatic. Neck is supple. Mallampati 1, red soft palate.  neck circumference: 16 inches . Nasal airflow patent.  Retrognathia is  seen.  Dental status: intact . Intact- Lower jaw.  Cardiovascular:  Regular rate and cardiac rhythm by pulse,  without distended neck veins. Respiratory: Lungs are clear to auscultation.  Skin:  Without evidence of ankle edema, or rash. Trunk: The patient's posture is erect.   Neurologic exam : The patient is awake and alert, oriented to place and time.   Memory subjective described as intact.  Attention span & concentration ability appears normal.  Speech is fluent,  without  dysarthria, dysphonia or aphasia.  Mood and affect are appropriate.   Cranial nerves: no loss of smell or taste reported - vaccinated both parts.  Pupils are equal and briskly reactive to light. Funduscopic exam deferred.  Wears glasses.  Extraocular movements in vertical and horizontal planes were intact and with horizontal endpoint  nystagmus. No Diplopia. Visual fields by finger perimetry are intact. Hearing was intact to soft voice and finger rubbing.   Facial sensation intact to fine touch. Facial motor strength is  symmetric and tongue and uvula move midline.  Neck ROM : rotation, tilt and flexion extension were normal for age and shoulder shrug was symmetrical.    Motor exam:  Symmetric bulk, tone and ROM.   Normal tone without cog wheeling, symmetric grip strength . Sensory:  Fine touch and vibration were  normal.  Proprioception tested in the upper extremities was normal. Coordination: Rapid alternating movements in the fingers/hands were of normal speed.  The Finger-to-nose maneuver was intact without evidence of ataxia, dysmetria or tremor. Gait and station: Patient could rise unassisted from a seated position, walked without assistive device.  Toe and heel walk were deferred.  Deep tendon reflexes: in the  upper and lower extremities are symmetric and intact.  Babinski response was deferred.    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  2)  Hypersomnia, mild  3)  Daytime sleepiness, non restorative sleep, need for naps.  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   My Plan is to proceed with:  1) either a HST or an attended PSG, both will work for OSA screening.  2) RLS/ PLMs would require an attended sleep study.    I would like to thank  [name], [address] [place],  [place] [address] for allowing me to meet with and to take care of this pleasant patient.   CC: I will share my  notes with PCP and see Dr [name] in 2-3 month. .  Electronically signed by: Larey Seat, MD 12/24/2019 10:16 AM  [facility] Board certified by The [employer] of Sleep Medicine and  Diplomate of the [employer] of Sleep Medicine. Board certified In Neurology through the [place], Fellow of the [employer] of Neurology. Medical Director of [facility].

## 2019-12-25 ENCOUNTER — Other Ambulatory Visit: Payer: Self-pay | Admitting: Neurology

## 2019-12-25 DIAGNOSIS — G2581 Restless legs syndrome: Secondary | ICD-10-CM

## 2019-12-25 DIAGNOSIS — R0683 Snoring: Secondary | ICD-10-CM

## 2019-12-25 DIAGNOSIS — G4710 Hypersomnia, unspecified: Secondary | ICD-10-CM

## 2019-12-31 DIAGNOSIS — M9903 Segmental and somatic dysfunction of lumbar region: Secondary | ICD-10-CM | POA: Diagnosis not present

## 2019-12-31 DIAGNOSIS — M47816 Spondylosis without myelopathy or radiculopathy, lumbar region: Secondary | ICD-10-CM | POA: Diagnosis not present

## 2020-01-07 DIAGNOSIS — M47816 Spondylosis without myelopathy or radiculopathy, lumbar region: Secondary | ICD-10-CM | POA: Diagnosis not present

## 2020-01-07 DIAGNOSIS — M9903 Segmental and somatic dysfunction of lumbar region: Secondary | ICD-10-CM | POA: Diagnosis not present

## 2020-01-09 ENCOUNTER — Telehealth: Payer: Self-pay

## 2020-01-09 DIAGNOSIS — M47816 Spondylosis without myelopathy or radiculopathy, lumbar region: Secondary | ICD-10-CM | POA: Diagnosis not present

## 2020-01-09 DIAGNOSIS — M9903 Segmental and somatic dysfunction of lumbar region: Secondary | ICD-10-CM | POA: Diagnosis not present

## 2020-01-09 NOTE — Telephone Encounter (Signed)
LVM for pt to call me back to schedule sleep study  

## 2020-01-11 MED FILL — CITALOPRAM HBR 40 MG TABLET: 40 | 30 days supply | Qty: 30 | Fill #7

## 2020-01-15 DIAGNOSIS — M47816 Spondylosis without myelopathy or radiculopathy, lumbar region: Secondary | ICD-10-CM | POA: Diagnosis not present

## 2020-01-15 DIAGNOSIS — M9903 Segmental and somatic dysfunction of lumbar region: Secondary | ICD-10-CM | POA: Diagnosis not present

## 2020-01-22 DIAGNOSIS — M9903 Segmental and somatic dysfunction of lumbar region: Secondary | ICD-10-CM | POA: Diagnosis not present

## 2020-01-22 DIAGNOSIS — M47816 Spondylosis without myelopathy or radiculopathy, lumbar region: Secondary | ICD-10-CM | POA: Diagnosis not present

## 2020-01-29 DIAGNOSIS — M9903 Segmental and somatic dysfunction of lumbar region: Secondary | ICD-10-CM | POA: Diagnosis not present

## 2020-01-29 DIAGNOSIS — M47816 Spondylosis without myelopathy or radiculopathy, lumbar region: Secondary | ICD-10-CM | POA: Diagnosis not present

## 2020-01-30 ENCOUNTER — Encounter: Payer: Self-pay | Admitting: Internal Medicine

## 2020-01-30 ENCOUNTER — Ambulatory Visit (INDEPENDENT_AMBULATORY_CARE_PROVIDER_SITE_OTHER): Payer: BC Managed Care – PPO | Admitting: Neurology

## 2020-01-30 DIAGNOSIS — G4710 Hypersomnia, unspecified: Secondary | ICD-10-CM

## 2020-01-30 DIAGNOSIS — R0683 Snoring: Secondary | ICD-10-CM

## 2020-01-30 DIAGNOSIS — G2581 Restless legs syndrome: Secondary | ICD-10-CM

## 2020-02-04 DIAGNOSIS — M9903 Segmental and somatic dysfunction of lumbar region: Secondary | ICD-10-CM | POA: Diagnosis not present

## 2020-02-04 DIAGNOSIS — M47816 Spondylosis without myelopathy or radiculopathy, lumbar region: Secondary | ICD-10-CM | POA: Diagnosis not present

## 2020-02-07 NOTE — Progress Notes (Signed)
Summary & Diagnosis of Dr. Hazle Quant Home Sleep test:  There is mild OSA present at an AHI of 6.8/h with some REM sleep related exacerbation to an AHI of 8/h. Most significant was the lack of  frequent movement artefact, indicating a calm, restful sleep -  And that the degree of apnea was very much dependent on supine sleep position. No  evidence of unusual cardiac rate variability, nor of hypoxia.  Treatment number one is to avoid supine sleep - if possible.  Snoring is likely present ( see RDI) and may respond also to  positional changes , to weight loss, and to a dental device- CPAP  can be entertained as well.  This mild degree of apnea can be treated by a dental device or  CPAP, but I consider CPAP optional.   I am happy to discuss these options with the patient.

## 2020-02-07 NOTE — Procedures (Signed)
  Patient Information     First Name: Benjamin Yopp. 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 He has a medical history of Allergies, Anxiety, Asthma, GERD (gastroesophageal reflux disease), Headache, and Migraine. Asthma is of allergic manifestation for him. GERD is an active problem. Wife reports snoring. He reports non- restorative sleep. Question of Sleep Apnea.       Summary & Diagnosis:    There is mild OSA present at an AHI of 6.8/h and with some REM exacerbation to an AHI of 8/h. Most significant was the lack of frequent movement artefact, indicating a calm, restful sleep - but apnea was very much dependent on supine sleep position. No evidence of unusual cardiac rate variability, nor of hypoxia.   Recommendations:      Treatment number one is to avoid supine sleep - if possible.  Snoring is likely present ( see RDI) and may respond also to positional changes , to weight loss, and to a dental device- CPAP can be entertained as well.  This mild degree of apnea can be treated by a dental device or CPAP, but I consider CPAP optional.  Interpreting Physician: Larey Seat, MD           Sleep Summary  Oxygen Saturation Statistics   Start Study Time: End Study Time: Total Recording Time:       11:09:32 PM       6:53:28 AM        7 h, 43 min  Total Sleep Time % REM of Sleep Time:  6 h, 57 min  7.3    Mean: 95 Minimum: 89 Maximum: 99  Mean of Desaturations Nadirs (%):   91  Oxygen Desaturation. %: 4-9 10-20 >20 Total  Events Number Total  21 100.0  0 0.0  0 0.0  21 100.0  Oxygen Saturation: <90 <=88 <85 <80 <70   Duration (minutes): Sleep % 0.4 0.0 0.1 0.0 0.0 0.0 0.0 0.0 0.0 0.0     Respiratory Indices      Total Events REM NREM All Night  pRDI:  62  pAHI:  47 ODI:  21  pAHIc:  12  % CSR: 0.0 8.0 8.0 2.0 0.0 9.0 6.7 3.1 1.9 8.9 6.8 3.0 1.7       Pulse Rate Statistics during Sleep (BPM)      Mean:  67 Minimum: 55 Maximum: 99    Indices are calculated using technically valid sleep time of 6 h, 56 min. pRDI/pAHI are calculated using oxi desaturations ? 3%  Body Position Statistics  Position Supine Prone Right Left Non-Supine  Sleep (min) 61.9 196.0 43.0 117.0 356.0  Sleep % 14.8 46.9 10.3 28.0 85.2  pRDI 27.4 6.8 1.4 5.7 5.8  pAHI 25.4 4.3 1.4 3.1 3.6  ODI 15.6 0.6 0.0 1.5 0.8     Snoring Statistics Snoring Level (dB) >40 >50 >60 >70 >80 >Threshold (45)  Sleep (min) 211.9 17.3 2.3 0.0 0.0 31.3  Sleep % 50.7 4.1 0.5 0.0 0.0 7.5    Mean: 41 dB Sleep Sta

## 2020-02-08 MED FILL — CITALOPRAM HBR 40 MG TABLET: 40 | 30 days supply | Qty: 30 | Fill #8

## 2020-02-13 DIAGNOSIS — M47816 Spondylosis without myelopathy or radiculopathy, lumbar region: Secondary | ICD-10-CM | POA: Diagnosis not present

## 2020-02-13 DIAGNOSIS — M9903 Segmental and somatic dysfunction of lumbar region: Secondary | ICD-10-CM | POA: Diagnosis not present

## 2020-02-19 ENCOUNTER — Telehealth: Payer: Self-pay | Admitting: *Deleted

## 2020-02-19 DIAGNOSIS — M47816 Spondylosis without myelopathy or radiculopathy, lumbar region: Secondary | ICD-10-CM | POA: Diagnosis not present

## 2020-02-19 DIAGNOSIS — M9903 Segmental and somatic dysfunction of lumbar region: Secondary | ICD-10-CM | POA: Diagnosis not present

## 2020-02-19 DIAGNOSIS — R0683 Snoring: Secondary | ICD-10-CM

## 2020-02-19 DIAGNOSIS — G2581 Restless legs syndrome: Secondary | ICD-10-CM

## 2020-02-19 DIAGNOSIS — G4710 Hypersomnia, unspecified: Secondary | ICD-10-CM

## 2020-02-19 NOTE — Telephone Encounter (Signed)
Called pt back. Relayed results per Dr. Edwena Felty note. He is going to think about his options and will call back to let us know how he would like to proceed.

## 2020-02-19 NOTE — Telephone Encounter (Signed)
-----   Message from Larey Seat, MD sent at 02/07/2020 10:07 AM EDT ----- Summary & Diagnosis of Dr. Hazle [facility] Sleep test:  There is mild OSA present at an AHI of 6.8/h with some REM sleep related exacerbation to an AHI of 8/h. Most significant was the lack of  frequent movement artefact, indicating a calm, restful sleep -  And that the degree of apnea was very much dependent on supine sleep position. No  evidence of unusual cardiac rate variability, nor of hypoxia.  Treatment number one is to avoid supine sleep - if possible.  Snoring is likely present ( see RDI) and may respond also to  positional changes , to weight loss, and to a dental device- CPAP  can be entertained as well.  This mild degree of apnea can be treated by a dental device or  CPAP, but I consider CPAP optional.   I am happy to discuss these options with the patient.

## 2020-02-25 ENCOUNTER — Telehealth: Payer: Self-pay | Admitting: Adult Health

## 2020-02-25 NOTE — Telephone Encounter (Signed)
Message received and they will process.

## 2020-02-25 NOTE — Telephone Encounter (Signed)
Pt called office today asking to start cpap therapy.  I called pt. I discussed cpap therapy process. Will send to [facility]. A follow up was made per insurance requirements for 05/26/20 at 8:30am. Pt verbalized understanding of recommendations.

## 2020-02-25 NOTE — Telephone Encounter (Signed)
Patient called LVM. Wants to proceed with CPAP therapy.   I am helping with VM only.

## 2020-02-25 NOTE — Addendum Note (Signed)
Addended by: Larey Seat on: 02/25/2020 04:30 PM   Modules accepted: Orders

## 2020-02-25 NOTE — Telephone Encounter (Signed)
I sent a community message to Vanessa Ralphs and Charmian Muff for new CPAP orders for [facility].

## 2020-02-25 NOTE — Telephone Encounter (Signed)
See telephone note for sleep study results.

## 2020-02-27 DIAGNOSIS — M9903 Segmental and somatic dysfunction of lumbar region: Secondary | ICD-10-CM | POA: Diagnosis not present

## 2020-02-27 DIAGNOSIS — M47816 Spondylosis without myelopathy or radiculopathy, lumbar region: Secondary | ICD-10-CM | POA: Diagnosis not present

## 2020-03-10 ENCOUNTER — Other Ambulatory Visit: Payer: Self-pay

## 2020-03-10 ENCOUNTER — Ambulatory Visit (AMBULATORY_SURGERY_CENTER): Payer: Self-pay | Admitting: *Deleted

## 2020-03-10 ENCOUNTER — Other Ambulatory Visit (HOSPITAL_COMMUNITY): Payer: Self-pay | Admitting: Internal Medicine

## 2020-03-10 VITALS — Ht 74.0 in | Wt 205.0 lb

## 2020-03-10 DIAGNOSIS — Z1211 Encounter for screening for malignant neoplasm of colon: Secondary | ICD-10-CM

## 2020-03-10 DIAGNOSIS — R7301 Impaired fasting glucose: Secondary | ICD-10-CM | POA: Diagnosis not present

## 2020-03-10 DIAGNOSIS — Z125 Encounter for screening for malignant neoplasm of prostate: Secondary | ICD-10-CM | POA: Diagnosis not present

## 2020-03-10 DIAGNOSIS — E786 Lipoprotein deficiency: Secondary | ICD-10-CM | POA: Diagnosis not present

## 2020-03-10 DIAGNOSIS — Z0000 Encounter for general adult medical examination without abnormal findings: Secondary | ICD-10-CM | POA: Diagnosis not present

## 2020-03-10 NOTE — Progress Notes (Signed)

## 2020-03-11 MED FILL — ESCITALOPRAM 10 MG TABLET: 10 | 90 days supply | Qty: 90 | Fill #0

## 2020-03-12 MED FILL — CITALOPRAM HBR 40 MG TABLET: 40 | 90 days supply | Qty: 90 | Fill #0

## 2020-03-13 HISTORY — PX: COLONOSCOPY W/ POLYPECTOMY: SHX1380

## 2020-03-20 DIAGNOSIS — G4733 Obstructive sleep apnea (adult) (pediatric): Secondary | ICD-10-CM | POA: Diagnosis not present

## 2020-03-21 ENCOUNTER — Ambulatory Visit (AMBULATORY_SURGERY_CENTER): Payer: BC Managed Care – PPO | Admitting: Internal Medicine

## 2020-03-21 ENCOUNTER — Other Ambulatory Visit: Payer: Self-pay

## 2020-03-21 ENCOUNTER — Encounter: Payer: Self-pay | Admitting: Internal Medicine

## 2020-03-21 VITALS — BP 111/62 | HR 63 | Temp 96.9°F | Resp 13 | Ht 74.0 in | Wt 205.0 lb

## 2020-03-21 DIAGNOSIS — Z1211 Encounter for screening for malignant neoplasm of colon: Secondary | ICD-10-CM

## 2020-03-21 DIAGNOSIS — D122 Benign neoplasm of ascending colon: Secondary | ICD-10-CM

## 2020-03-21 DIAGNOSIS — G4730 Sleep apnea, unspecified: Secondary | ICD-10-CM | POA: Diagnosis not present

## 2020-03-21 DIAGNOSIS — K219 Gastro-esophageal reflux disease without esophagitis: Secondary | ICD-10-CM | POA: Diagnosis not present

## 2020-03-21 DIAGNOSIS — R7301 Impaired fasting glucose: Secondary | ICD-10-CM | POA: Diagnosis not present

## 2020-03-21 DIAGNOSIS — D120 Benign neoplasm of cecum: Secondary | ICD-10-CM | POA: Diagnosis not present

## 2020-03-21 DIAGNOSIS — D123 Benign neoplasm of transverse colon: Secondary | ICD-10-CM | POA: Diagnosis not present

## 2020-03-21 DIAGNOSIS — J45909 Unspecified asthma, uncomplicated: Secondary | ICD-10-CM | POA: Diagnosis not present

## 2020-03-21 DIAGNOSIS — Z0000 Encounter for general adult medical examination without abnormal findings: Secondary | ICD-10-CM | POA: Diagnosis not present

## 2020-03-21 DIAGNOSIS — Z1331 Encounter for screening for depression: Secondary | ICD-10-CM | POA: Diagnosis not present

## 2020-03-21 MED ORDER — SODIUM CHLORIDE 0.9 % IV SOLN: 500.0000 mL | Freq: Once | INTRAVENOUS | Status: DC

## 2020-03-21 MED FILL — ALBUTEROL SULFATE HFA 108 (: 108 (90 BAS | 17 days supply | Qty: 9 | Fill #0

## 2020-03-21 NOTE — Progress Notes (Signed)
VS-CW  Pt's states no medical or surgical changes since previsit or office visit.  

## 2020-03-21 NOTE — Progress Notes (Signed)
pt tolerated well. VSS. awake and to recovery. Report given to RN.  

## 2020-03-21 NOTE — Op Note (Signed)
[name] Procedure Date: 03/21/2020 8:27 AM MRN: [id number] Endoscopist: Gatha Mayer , MD Age: 49 Referring MD:  Date of Birth: 27-Nov-1970 Gender: Male Account #: [id number] Procedure:                Colonoscopy Indications:              Screening for colorectal malignant neoplasm Medicines:                Propofol per Anesthesia, Monitored Anesthesia Care Procedure:                Pre-Anesthesia Assessment:                           - Prior to the procedure, a History and Physical                            was performed, and patient medications and                            allergies were reviewed. The patient's tolerance of                            previous anesthesia was also reviewed. The risks                            and benefits of the procedure and the sedation                            options and risks were discussed with the patient.                            All questions were answered, and informed consent                            was obtained. Prior Anticoagulants: The patient has                            taken no previous anticoagulant or antiplatelet                            agents. ASA Grade Assessment: II - A patient with                            mild systemic disease. After reviewing the risks                            and benefits, the patient was deemed in                            satisfactory condition to undergo the procedure.                           After obtaining informed consent, the colonoscope  was passed under direct vision. Throughout the                            procedure, the patient's blood pressure, pulse, and                            oxygen saturations were monitored continuously. The                            Colonoscope was introduced through the anus and                            advanced to the the cecum, identified by                             appendiceal orifice and ileocecal valve. The                            colonoscopy was performed without difficulty. The                            patient tolerated the procedure well. The quality                            of the bowel preparation was good. The bowel                            preparation used was Miralax via split dose                            instruction. The ileocecal valve, appendiceal                            orifice, and rectum were photographed. Scope In: 8:44:29 AM Scope Out: 9:11:01 AM Scope Withdrawal Time: 0 hours 22 minutes 20 seconds  Total Procedure Duration: 0 hours 26 minutes 32 seconds  Findings:                 The perianal and digital rectal examinations were                            normal. Pertinent negatives include normal prostate                            (size, shape, and consistency).                           Five sessile polyps were found in the transverse                            colon, ascending colon and cecum. The polyps were 1                            to 2 mm in size. These polyps were removed with a  cold biopsy forceps. Resection and retrieval were                            complete. Verification of patient identification                            for the specimen was done. Estimated blood loss was                            minimal.                           The exam was otherwise without abnormality on                            direct and retroflexion views. Complications:            No immediate complications. Estimated Blood Loss:     Estimated blood loss was minimal. Impression:               - Five 1 to 2 mm polyps in the transverse colon, in                            the ascending colon and in the cecum, removed with                            a cold biopsy forceps. Resected and retrieved.                           - The examination was otherwise normal on direct                             and retroflexion views. Recommendation:           - Patient has a contact number available for                            emergencies. The signs and symptoms of potential                            delayed complications were discussed with the                            patient. Return to normal activities tomorrow.                            Written discharge instructions were provided to the                            patient.                           - Resume previous diet.                           - Continue present medications.                           -  Repeat colonoscopy is recommended. The                            colonoscopy date will be determined after pathology                            results from today's exam become available for                            review. Gatha Mayer, MD 03/21/2020 9:18:35 AM This report has been signed electronically.

## 2020-03-21 NOTE — Patient Instructions (Addendum)
I found and removed five 1-2 mm polyps. All else normal.  I will let you know pathology results and when to have another routine colonoscopy by mail and/or My Chart.  I appreciate the opportunity to care for you. Gatha Mayer, MD, FACG  YOU HAD AN ENDOSCOPIC PROCEDURE TODAY AT [facility]:   Refer to the procedure report that was given to you for any specific questions about what was found during the examination.  If the procedure report does not answer your questions, please call your gastroenterologist to clarify.  If you requested that your care partner not be given the details of your procedure findings, then the procedure report has been included in a sealed envelope for you to review at your convenience later.  YOU SHOULD EXPECT: Some feelings of bloating in the abdomen. Passage of more gas than usual.  Walking can help get rid of the air that was put into your GI tract during the procedure and reduce the bloating. If you had a lower endoscopy (such as a colonoscopy or flexible sigmoidoscopy) you may notice spotting of blood in your stool or on the toilet paper. If you underwent a bowel prep for your procedure, you may not have a normal bowel movement for a few days.  Please Note:  You might notice some irritation and congestion in your nose or some drainage.  This is from the oxygen used during your procedure.  There is no need for concern and it should clear up in a day or so.  SYMPTOMS TO REPORT IMMEDIATELY:   Following lower endoscopy (colonoscopy or flexible sigmoidoscopy):  Excessive amounts of blood in the stool  Significant tenderness or worsening of abdominal pains  Swelling of the abdomen that is new, acute  Fever of 100F or higher  For urgent or emergent issues, a gastroenterologist can be reached at any hour by calling [phone]. Do not use MyChart messaging for urgent concerns.    DIET:  We do recommend a small meal at first, but then you may  proceed to your regular diet.  Drink plenty of fluids but you should avoid alcoholic beverages for 24 hours.  ACTIVITY:  You should plan to take it easy for the rest of today and you should NOT DRIVE or use heavy machinery until tomorrow (because of the sedation medicines used during the test).    FOLLOW UP: Our staff will call the number listed on your records 48-72 hours following your procedure to check on you and address any questions or concerns that you may have regarding the information given to you following your procedure. If we do not reach you, we will leave a message.  We will attempt to reach you two times.  During this call, we will ask if you have developed any symptoms of COVID 19. If you develop any symptoms (ie: fever, flu-like symptoms, shortness of breath, cough etc.) before then, please call [phone].  If you test positive for Covid 19 in the 2 weeks post procedure, please call and report this information to [place].    If any biopsies were taken you will be contacted by phone or by letter within the next 1-3 weeks.  Please call us at [phone] if you have not heard about the biopsies in 3 weeks.    SIGNATURES/CONFIDENTIALITY: You and/or your care partner have signed paperwork which will be entered into your electronic medical record.  These signatures attest to the fact that that the information above  on your After Visit Summary has been reviewed and is understood.  Full responsibility of the confidentiality of this discharge information lies with you and/or your care-partner.

## 2020-03-25 ENCOUNTER — Telehealth: Payer: Self-pay

## 2020-03-25 ENCOUNTER — Encounter: Payer: Self-pay | Admitting: Internal Medicine

## 2020-03-25 DIAGNOSIS — Z86010 Personal history of colonic polyps: Secondary | ICD-10-CM | POA: Insufficient documentation

## 2020-03-25 DIAGNOSIS — Z860101 Personal history of adenomatous and serrated colon polyps: Secondary | ICD-10-CM

## 2020-03-25 HISTORY — DX: Personal history of colonic polyps: Z86.010

## 2020-03-25 HISTORY — DX: Personal history of adenomatous and serrated colon polyps: Z86.0101

## 2020-03-25 NOTE — Telephone Encounter (Signed)
Left message on follow up call. 

## 2020-03-25 NOTE — Telephone Encounter (Signed)
2nd follow up call made.  NALM 

## 2020-03-26 MED FILL — PANTOPRAZOLE SOD DR 40 MG T: 40 | 90 days supply | Qty: 90 | Fill #1

## 2020-04-14 MED FILL — METOPROLOL TARTRATE 25 MG T: 25 | 90 days supply | Qty: 180 | Fill #0

## 2020-04-20 DIAGNOSIS — G4733 Obstructive sleep apnea (adult) (pediatric): Secondary | ICD-10-CM | POA: Diagnosis not present

## 2020-04-24 MED FILL — METOPROLOL TARTRATE 25 MG T: 25 | 90 days supply | Qty: 180 | Fill #0

## 2020-05-21 DIAGNOSIS — G4733 Obstructive sleep apnea (adult) (pediatric): Secondary | ICD-10-CM | POA: Diagnosis not present

## 2020-05-26 ENCOUNTER — Encounter: Payer: Self-pay | Admitting: Neurology

## 2020-05-26 ENCOUNTER — Ambulatory Visit (INDEPENDENT_AMBULATORY_CARE_PROVIDER_SITE_OTHER): Payer: BC Managed Care – PPO | Admitting: Neurology

## 2020-05-26 VITALS — BP 128/86 | HR 67 | Ht 74.0 in | Wt 209.0 lb

## 2020-05-26 DIAGNOSIS — R0683 Snoring: Secondary | ICD-10-CM | POA: Diagnosis not present

## 2020-05-26 DIAGNOSIS — G4710 Hypersomnia, unspecified: Secondary | ICD-10-CM | POA: Diagnosis not present

## 2020-05-26 DIAGNOSIS — G4733 Obstructive sleep apnea (adult) (pediatric): Secondary | ICD-10-CM

## 2020-05-26 DIAGNOSIS — G4730 Sleep apnea, unspecified: Secondary | ICD-10-CM

## 2020-05-26 DIAGNOSIS — Z9989 Dependence on other enabling machines and devices: Secondary | ICD-10-CM | POA: Diagnosis not present

## 2020-05-26 NOTE — Progress Notes (Signed)
Order for cpap pressure change sent to [facility] via community message. Confirmation received that the order transmitted was successful.

## 2020-05-26 NOTE — Progress Notes (Signed)
SLEEP MEDICINE CLINIC    Provider:  Larey Seat, MD  Primary Care Physician:  [name], MD [place] [address]     Referring Provider: [name], [address] [place],  [place] [address]          Chief Complaint according to patient   Patient presents with:    . 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 Dr. Oletta Darter underwent a home sleep test on Jan 30, 2020 which revealed a mild obstructive sleep apnea with an AHI!  6.8/h and was some REM exacerbation but not REM sleep dependence.  There was no unusual cardiac variability by rate nor was there sleep hypoxemia.  And I considered CPAP to be optional but could have also been a dental device used however to see an effect of apnea treatment on the patient's wellbeing would be quickest achieved with CPAP.  There was also an RDI of 8.9 which indicates at least moderate if not loud snoring.  He has not followed up after 3 months of being on CPAP and has been a 93% compliant user.  The average user time is 7 hours 12 minutes the minimum pressure is set at 5 the maximum pressure of 12 cm CPAP with an expiratory pressure relief of 3 cm allowing for easier exhalation.  His residual AHI is 3.1 and is almost equally divided between central and obstructive apneas which is expected in the treatment of mild apnea.  The 95th percentile pressure is 9.6 cmH2O and straddles the upper limit of 12 cm x 3 cm relief.  The patient may do even better with 1 or 2 cm more maximum pressure.  This is an automatic Pap titration which means that if no apneas occur there is no need for the machine to use the higher pressure settings.  He should not have the feeling of swallowing air for getting sinus pressure.  He feels overall much better and he also stated that his wife sleeps better now that his snoring and apneas are gone his fatigue severity  score was endorsed at 23 points and Epworth sleepiness score at 5 points which is a great improvement.  He feels less affected by allergies, his nasal passages have remained clear .  Overall, we will part today with a oxygen of a 2 cm increased in pressure and a refill for his supplies: mask tubing etc.     CONSULTATION :  Chief concern according to patient : I just don't sleep enough and I snore.   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  He  has a past medical history of Allergies, Allergy, Anxiety, Asthma, GERD (gastroesophageal reflux disease), Headache, adenomatous colonic polyps (03/25/2020), Migraine, and Sleep apnea. Asthma is an allergic manifestation for him. GERD is an active problem. 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   Hypersomnia, mild   Daytime sleepiness, non restorative sleep, need for naps.   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     Sleep relevant medical history: wisdom teeth removed.Family medical /sleep history: Mother and brother used CPAP for OSA. Social history: Patient is working as a Herbalist, [employer].  and  lives in a household with wife and 2 children, son is freshman in college, daughter is 83.  The patient currently works and takes call as MD. Pets are present- one dog. Tobacco use: none .  ETOH use; 2-3 times a week,  Caffeine intake in form of Coffee( 1 in AM  Soda( none ) Tea ( none) nor energy drinks. Regular exercise in form of walking 3 miles a day, and playing tennis.     Sleep habits are as follows: The patient's dinner time is variable due to kid's sports, usually between 7-8 PM. The patient goes to bed at 11 PM and is asleep promptly , continues to sleep for several hours, wakes for one  bathroom break, but mostly for snoring and apnea- wife will wake him and tell him that he didn't breathe. The  preferred sleep position is prone, with the support of 1-2 pillows.  Had a right shoulder injury and sleeps avoiding the right shoulder. Dreams are reportedly frequent/vivid.  6.30- 7  AM is the usual rise time. The patient wakes up with an alarm. On weekends , he sleeps until 9 AM and he used to be able to sleep 12 hours.  He reports not feeling refreshed or restored in AM, with symptoms such as dry mouth , morning headaches sometimes, and residual fatigue.  Naps are taken on weekends frequently after lunch , lasting from 30-90 minutes and are more refreshing than nocturnal sleep.    Review of Systems: Out of a complete 14 system review, the patient complains of only the following symptoms, and all other reviewed systems are negative.:  Fatigue, sleepiness , snoring, fragmented sleep, Insomnia- when snoring.   How likely are you to doze in the following situations: 0 = not likely, 1 = slight chance, 2 = moderate chance, 3 = high chance   Sitting and Reading? 2 Watching Television? 1 Sitting inactive in a public place (theater or meeting)? 1 As a passenger in a car for an hour without a break? 2 Lying down in the afternoon when circumstances permit? 2 Sitting and talking to someone?0 Sitting quietly after lunch without alcohol? 3 In a car, while stopped for a few minutes in traffic?   Total = 11/ 24 points   FSS endorsed at 27/ 63 points.   Social History   Socioeconomic History  . Marital status: Married    Spouse name: Not on file  . Number of children: 2  . Years of education: college  . Highest education level: Professional school degree (e.g., MD, DDS, DVM, JD)  Occupational History  . 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 Smoking status: Never Smoker  . Smokeless tobacco: Never Used  Substance and Sexual Activity  . Alcohol use: Yes    Comment: 1-2 drinks twice weekly  . Drug use: Never  . Sexual activity: Not on file  Other Topics Concern  . Not on  file  Social History Narrative   Lives at home with wife and family.   Right-handed.   1 cup caffeine per day.   Social Determinants of Health   Financial Resource Strain:   . Difficulty of Paying Living Expenses: Not on file  Food Insecurity:   . Worried About Charity fundraiser in the Last Year: Not on file  . Ran Out of Food in the Last Year: Not on file  Transportation Needs:   . Lack of Transportation (Medical): Not on file  . Lack of Transportation (  Non-Medical): Not on file  Physical Activity:   . Days of Exercise per Week: Not on file  . Minutes of Exercise per Session: Not on file  Stress:   . Feeling of Stress : Not on file  Social Connections:   . Frequency of Communication with Friends and Family: Not on file  . Frequency of Social Gatherings with Friends and Family: Not on file  . Attends Religious Services: Not on file  . Active Member of Clubs or Organizations: Not on file  . Attends Archivist Meetings: Not on file  . Marital Status: Not on file    Family History  Problem Relation Age of Onset  . Diabetes Mother   . Other Mother        Trigeminal Neuralgia, Meningioma (removal 1990s)  . Hypertension Mother   . Migraines Mother   . Sleep apnea Mother   . Crohn's disease Mother   . Asthma Mother   . Colon polyps Mother   . Heart disease Father   . Asthma Brother   . Sleep apnea Brother   . Heart disease Paternal Grandfather   . Prostate cancer Paternal Grandfather   . Colon cancer Maternal Grandmother   . Esophageal cancer Neg Hx   . Stomach cancer Neg Hx   . Rectal cancer Neg Hx     Past Medical History:  Diagnosis Date  . Allergies   . Allergy   . Anxiety   . Asthma   . GERD (gastroesophageal reflux disease)   . Headache   . Hx of adenomatous colonic polyps 03/25/2020  . Migraine   . Sleep apnea    not currently using cpap    Past Surgical History:  Procedure Laterality Date  . shoulder surgery  2012  . VEIN SURGERY  2003   . WISDOM TOOTH EXTRACTION  2001     Current Outpatient Medications on File Prior to Visit  Medication Sig Dispense Refill  . citalopram (CELEXA) 40 MG tablet Take 40 mg by mouth daily.    . fluticasone (FLONASE) 50 MCG/ACT nasal spray Place 1 spray into both nostrils daily.    Marland Kitchen loratadine (CLARITIN) 10 MG tablet Take 10 mg by mouth daily.    Marland Kitchen MAGNESIUM PO Take 500 mg by mouth daily.    . metoprolol tartrate (LOPRESSOR) 25 MG tablet Take 25 mg by mouth 2 (two) times daily.    . Multiple Vitamin (MULTIVITAMIN) tablet Take 1 tablet by mouth daily.    . pantoprazole (PROTONIX) 40 MG tablet Take 40 mg by mouth daily.    Marland Kitchen VITAMIN D PO Take 2,000 Units by mouth daily.     No current facility-administered medications on file prior to visit.    Allergies  Allergen Reactions  . Amoxicillin Hives  . Cephalosporins Hives    Physical exam:  Today's Vitals   05/26/20 0815  BP: 128/86  Pulse: 67  Weight: 209 lb (94.8 kg)  Height: 6\' 2"  (1.88 m)   Body mass index is 26.83 kg/m.   Wt Readings from Last 3 Encounters:  05/26/20 209 lb (94.8 kg)  03/21/20 205 lb (93 kg)  03/10/20 205 lb (93 kg)     Ht Readings from Last 3 Encounters:  05/26/20 6\' 2"  (1.88 m)  03/21/20 6\' 2"  (1.88 m)  03/10/20 6\' 2"  (1.88 m)      General: The patient is awake, alert and appears not in acute distress. The patient is well groomed. Head: Normocephalic, atraumatic. Neck is  supple. Mallampati 1, red soft palate.  neck circumference: 16 inches . Nasal airflow patent.  Retrognathia is  seen.  Dental status: intact . Intact- Lower jaw.  Cardiovascular:  Regular rate and cardiac rhythm by pulse,  without distended neck veins. Respiratory: Lungs are clear to auscultation.  Skin:  Without evidence of ankle edema, or rash. Trunk: The patient's posture is erect.   Neurologic exam : The patient is awake and alert, oriented to place and time.   Memory subjective described as intact.  Attention span &  concentration ability appears normal.  Speech is fluent,  without  dysarthria, dysphonia or aphasia.  Mood and affect are appropriate.   Cranial nerves: no loss of smell or taste reported - vaccinated both parts.  Pupils are equal and briskly reactive to light. Funduscopic exam deferred.  Wears glasses.  Extraocular movements in vertical and horizontal planes were intact and with horizontal endpoint  nystagmus. No Diplopia. Visual fields by finger perimetry are intact. Hearing was intact to soft voice and finger rubbing.   Facial sensation intact to fine touch. Facial motor strength is symmetric and tongue and uvula move midline.  Neck ROM : rotation, tilt and flexion extension were normal for age and shoulder shrug was symmetrical.    Motor exam:  Symmetric bulk, tone and ROM.   Normal tone without cog wheeling, symmetric grip strength . Sensory:  Fine touch and vibration were  normal.  Proprioception tested in the upper extremities was normal. Coordination: Rapid alternating movements in the fingers/hands were of normal speed.  The Finger-to-nose maneuver was intact without evidence of ataxia, dysmetria or tremor. Gait and station: Patient could rise unassisted from a seated position, walked without assistive device.  Toe and heel walk were deferred.  Deep tendon reflexes: in the  upper and lower extremities are symmetric and intact.  Babinski response was deferred.    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  ,snoring and has been sleeping longer and better- no more Hypersomnia,, improved allergies.  He is much less restless and his PLMs have improved , according to spouse .     My Plan is to proceed with:  1)  increase max pressure to 15.5 cm water on CPA, refill supplies- he  use nasal pillows. [place].  I would like to thank  [name], MD- [address] [place],  [place] [address],  for allowing me to meet with and to take care of this pleasant patient.   CC: I will share my notes with PCP and see Dr [name] in 12 month. .  Electronically signed by: Larey Seat, MD 05/26/2020 8:37 AM  [facility] Board certified by The [employer] of Sleep Medicine and Diplomate of the [employer] of Sleep Medicine. Board certified In Neurology through the [employer], Fellow of the [employer] of Neurology. Medical Director of [facility].

## 2020-05-26 NOTE — Patient Instructions (Signed)

## 2020-06-05 DIAGNOSIS — J3089 Other allergic rhinitis: Secondary | ICD-10-CM | POA: Diagnosis not present

## 2020-06-06 MED FILL — CITALOPRAM HBR 40 MG TABLET: 40 | 90 days supply | Qty: 90 | Fill #1

## 2020-06-20 DIAGNOSIS — G4733 Obstructive sleep apnea (adult) (pediatric): Secondary | ICD-10-CM | POA: Diagnosis not present

## 2020-06-20 MED FILL — PANTOPRAZOLE SOD DR 40 MG T: 40 | 90 days supply | Qty: 90 | Fill #2

## 2020-09-04 ENCOUNTER — Other Ambulatory Visit (HOSPITAL_COMMUNITY): Payer: Self-pay | Admitting: Internal Medicine

## 2020-09-04 MED FILL — METOPROLOL TARTRATE 25 MG T: 25 | 90 days supply | Qty: 180 | Fill #0

## 2020-09-04 MED FILL — CITALOPRAM HBR 40 MG TABLET: 40 | 90 days supply | Qty: 90 | Fill #2

## 2020-09-15 MED FILL — PANTOPRAZOLE SOD DR 40 MG T: 40 | 30 days supply | Qty: 30 | Fill #3

## 2020-10-24 MED FILL — PANTOPRAZOLE SOD DR 40 MG T: 40 | 30 days supply | Qty: 30 | Fill #4

## 2020-11-21 DIAGNOSIS — J4530 Mild persistent asthma, uncomplicated: Secondary | ICD-10-CM | POA: Diagnosis not present

## 2020-11-21 DIAGNOSIS — J3089 Other allergic rhinitis: Secondary | ICD-10-CM | POA: Diagnosis not present

## 2020-11-21 DIAGNOSIS — H1045 Other chronic allergic conjunctivitis: Secondary | ICD-10-CM | POA: Diagnosis not present

## 2020-11-21 MED FILL — PANTOPRAZOLE SOD DR 40 MG T: 40 | 30 days supply | Qty: 30 | Fill #5

## 2020-12-08 MED FILL — CITALOPRAM HBR 40 MG TABLET: 40 | 90 days supply | Qty: 90 | Fill #3

## 2020-12-20 ENCOUNTER — Other Ambulatory Visit (HOSPITAL_COMMUNITY): Payer: Self-pay | Admitting: Internal Medicine

## 2020-12-22 ENCOUNTER — Other Ambulatory Visit (HOSPITAL_COMMUNITY): Payer: Self-pay

## 2020-12-22 MED ORDER — PANTOPRAZOLE SODIUM 40 MG PO TBEC
40.0000 mg | DELAYED_RELEASE_TABLET | Freq: Every day | ORAL | 3 refills | Status: DC
  Filled 2020-12-22: qty 90, 90d supply, fill #0
  Filled 2021-03-19: qty 90, 90d supply, fill #1
  Filled 2021-06-22: qty 90, 90d supply, fill #2
  Filled 2021-09-07: qty 90, 90d supply, fill #3

## 2021-02-02 DIAGNOSIS — J3089 Other allergic rhinitis: Secondary | ICD-10-CM | POA: Diagnosis not present

## 2021-02-16 DIAGNOSIS — G4733 Obstructive sleep apnea (adult) (pediatric): Secondary | ICD-10-CM | POA: Diagnosis not present

## 2021-02-22 ENCOUNTER — Encounter: Payer: Self-pay | Admitting: Neurology

## 2021-02-24 ENCOUNTER — Ambulatory Visit (INDEPENDENT_AMBULATORY_CARE_PROVIDER_SITE_OTHER): Payer: BC Managed Care – PPO | Admitting: Neurology

## 2021-02-24 ENCOUNTER — Encounter: Payer: Self-pay | Admitting: Neurology

## 2021-02-24 VITALS — BP 122/83 | HR 64 | Ht 74.0 in | Wt 206.0 lb

## 2021-02-24 DIAGNOSIS — G4733 Obstructive sleep apnea (adult) (pediatric): Secondary | ICD-10-CM

## 2021-02-24 DIAGNOSIS — Z9989 Dependence on other enabling machines and devices: Secondary | ICD-10-CM

## 2021-02-24 NOTE — Progress Notes (Signed)
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  His 30-day compliance is 97% percent average use at time of 7 hours 50 minutes.  He is using an AutoSet between 5 and 14 cm water pressure with 3 cm expiratory pressure relief.  Residual AHI is 1.8/h which is excellent.  95th percentile leakage is 0.5 L of air per minute which is excellent as well.  The 95th percentile pressure is 9.5 cmH2O and well within the current settings.  Pressure from my tonight has not varied very much and has been only 1 outlier for the AHI and tonight's were elevated and significant air leak has been present.  His Epworth sleepiness score is endorsed at 8 points. RLS is controlled. No snoring on CPAP, sleeping through.        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  6.8/h and was some REM exacerbation but not REM sleep dependence.  There was no unusual cardiac variability by rate nor was there sleep hypoxemia.  And I considered CPAP to be optional but could have also been a dental device used however to see an effect of apnea treatment on the patient's wellbeing would be quickest achieved with CPAP.  There was also an RDI of 8.9 which indicates at least moderate if not loud snoring.  He has not followed up after 3 months of being on CPAP and has been a 93% compliant user.  The average user time is 7 hours 12 minutes the minimum pressure is set at 5  the maximum pressure of 12 cm CPAP with an expiratory pressure relief of 3 cm allowing for easier exhalation.  His residual AHI is 3.1 and is almost equally divided between central and obstructive apneas which is expected in the treatment of mild apnea.  The 95th percentile pressure is 9.6 cmH2O and straddles the upper limit of 12 cm x 3 cm relief.  The patient may do even better with 1 or 2 cm more maximum pressure.  This is an automatic Pap titration which means that if no apneas occur there is no need for the machine to use the higher pressure settings.  He should not have the feeling of swallowing air for getting sinus pressure.  He feels overall much better and he also stated that his wife sleeps better now that his snoring and apneas are gone his fatigue severity score was endorsed at 23 points and Epworth sleepiness score at 5 points which is a great improvement.  He feels less affected by allergies, his nasal passages have remained clear .  Overall, we will part today with a oxygen of a 2 cm increased in pressure and a refill for his supplies: mask tubing etc.     CONSULTATION :  Chief  concern according to patient : I just don't sleep enough and I snore.   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  He  has a past medical history of Allergies, Allergy, Anxiety, Asthma, GERD (gastroesophageal reflux disease), Headache, adenomatous colonic polyps (03/25/2020), Migraine, and Sleep apnea. Asthma is an allergic manifestation for him. GERD is an active problem. Dr. Janann Rivera has snoring and has been woken by his wife when she noted apnea.  Hypersomnia, mild  Daytime sleepiness, non restorative sleep, need for naps. 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     Sleep relevant medical history: wisdom teeth removed.Family medical /sleep history: Mother and brother used CPAP for OSA. Social history: Patient is working as a  Herbalist, [employer].  and lives in a household with wife and 2 children, son is freshman in college, daughter is 75.  The patient currently works and takes call as MD. Pets are present- one dog. Tobacco use: none .  ETOH use; 2-3 times a week,  Caffeine intake in form of Coffee( 1 in AM  Soda( none ) Tea ( none) nor energy drinks.this has decreased since being on CPAP.  Regular exercise in form of walking 3 miles a day, and playing tennis.     Sleep habits are as follows: The patient's dinner time is variable due to kid's sports, usually between 7-8 PM. The patient goes to bed at 11 PM and is asleep promptly , continues to sleep for several hours, wakes for one  bathroom break, but mostly for snoring and apnea- wife will wake him and tell him that he didn't breathe. The preferred sleep position is prone, with the support of 1-2 pillows.  Had a right shoulder injury and sleeps avoiding the right shoulder. Dreams are reportedly frequent/vivid.  6.30- 7  AM is the usual rise time. The patient wakes up with an alarm. On weekends , he sleeps until 9 AM and he used to be able to sleep 12 hours.  He reports not feeling refreshed or restored in AM, with symptoms such as dry mouth , morning headaches sometimes, and residual fatigue.  Naps are taken on weekends frequently after lunch , lasting from 30-90 minutes and are more refreshing than nocturnal sleep.    Review of Systems: Out of a complete 14 system review, the patient complains of only the following symptoms, and all other reviewed systems are negative.:  Fatigue, sleepiness , snoring, fragmented sleep, Insomnia- when snoring.   How likely are you to doze in the following situations: 0 = not likely, 1 = slight chance, 2 = moderate chance, 3 = high chance   Sitting and Reading? 2 Watching Television? 1 Sitting inactive in a public place (theater or meeting)? 1 As a passenger in a car for an hour without a break? 2 Lying  down in the afternoon when circumstances permit? 2 Sitting and talking to someone?0 Sitting quietly after lunch without alcohol? 3 In a car, while stopped for a few minutes in traffic?   Total = 8/ 24 points   FSS endorsed at 27/ 63 points.   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   Right-handed.   1 cup caffeine per day.   Social Determinants of Radio broadcast assistant Strain: Not on file  Food Insecurity: Not on file  Transportation Needs: Not on file  Physical Activity: Not on file  Stress: Not on file  Social Connections: Not on file    Family History  Problem Relation Age of Onset   Diabetes Mother    Other Mother        Trigeminal Neuralgia, Meningioma (removal 1990s)   Hypertension Mother    Migraines Mother    Sleep apnea Mother    Crohn's disease Mother    Asthma Mother    Colon polyps Mother    Heart disease Father    Asthma Brother    Sleep apnea Brother    Heart disease Paternal Grandfather    Prostate cancer Paternal Grandfather    Colon cancer Maternal Grandmother    Esophageal cancer Neg Hx    Stomach cancer Neg Hx    Rectal cancer Neg Hx     Past Medical History:  Diagnosis Date   Allergies    Allergy    Anxiety    Asthma    GERD (gastroesophageal reflux disease)    Headache    Hx of adenomatous colonic polyps 03/25/2020   Migraine    Sleep apnea    not currently using cpap    Past  Surgical History:  Procedure Laterality Date   shoulder surgery  2012   [place]  2003   WISDOM TOOTH EXTRACTION  2001     Current Outpatient Medications on File Prior to Visit  Medication Sig Dispense Refill   citalopram (CELEXA) 40 MG tablet TAKE 1 TABLET BY MOUTH DAILY 90 tablet 5   fluticasone (FLONASE) 50 MCG/ACT nasal spray Place 1 spray into both nostrils daily.     loratadine (CLARITIN) 10 MG tablet Take 10 mg by mouth daily.     MAGNESIUM PO Take 500 mg by mouth daily.     metoprolol tartrate (LOPRESSOR) 25 MG tablet TAKE 1 TABLET BY MOUTH DAILY, THEN INCREASE TO TAKE 1 TABLET BY MOUTH TWO TIMES DAILY AS NEEDED 180 tablet 3   Multiple Vitamin (MULTIVITAMIN) tablet Take 1 tablet by mouth daily.     pantoprazole (PROTONIX) 40 MG tablet Take 1 tablet (40 mg total) by mouth daily. 90 tablet 3   VITAMIN D PO Take 2,000 Units by mouth daily.     No current facility-administered medications on file prior to visit.    Allergies  Allergen Reactions   Amoxicillin Hives   Cephalosporins Hives   Cephalosporins Other (See Comments)   Penicillins Other (See Comments)    Physical exam:  Today's Vitals   02/24/21 0923  BP: 122/83  Pulse: 64  Weight: 206 lb (93.4 kg)  Height: 6\' 2"  (1.88 m)   Body mass index is 26.45 kg/m.   Wt Readings from Last 3 Encounters:  02/24/21 206 lb (93.4 kg)  05/26/20 209 lb (94.8 kg)  03/21/20 205 lb (93 kg)     Ht Readings from Last 3 Encounters:  02/24/21 6\' 2"  (1.88 m)  05/26/20 6\' 2"  (1.88 m)  03/21/20 6\' 2"  (1.88 m)      General: The patient is awake, alert and appears not in acute distress. The patient is well groomed. Head: Normocephalic, atraumatic. Neck is supple. Mallampati 1, red soft palate.  neck circumference: 16.25 inches .  Nasal airflow patent.  Retrognathia is seen.  Dental status: intact . Intact- Lower jaw.  Cardiovascular:  Regular rate and cardiac rhythm by pulse,  without distended neck veins. Respiratory:  Lungs are clear to auscultation.  Skin:  Without evidence of ankle edema, or rash. Trunk: The patient's posture is erect.   Neurologic exam : The patient is awake and alert, oriented to place and time.   Memory subjective described as intact.  Attention span & concentration ability appears normal.  Speech is fluent, appropriate.   Cranial nerves: no loss of smell or taste reported - vaccinated .  Pupils are equal and briskly reactive to light. Funduscopic exam deferred.  Wears glasses.  Extraocular movements in vertical and horizontal planes were intact and with horizontal endpoint  nystagmus. No Diplopia. Visual fields by finger perimetry are intact. Hearing was intact to soft voice and finger rubbing.   Facial sensation intact to fine touch. Facial motor strength is symmetric - tongue and uvula move midline.  Neck ROM : rotation, tilt and flexion extension were normal for age and shoulder shrug was symmetrical.    Motor exam:  Symmetric bulk, tone and ROM.   Normal tone without cog wheeling, symmetric grip strength . Sensory:  Fine touch and vibration were  normal.  Proprioception tested in the upper extremities was normal. Coordination: Rapid alternating movements in the fingers/hands were of normal speed.  The Finger-to-nose maneuver was intact without evidence of ataxia, dysmetria or tremor. Gait and station: Patient could rise unassisted from a seated position, walked without assistive device.  Toe and heel walk were deferred.  Deep tendon reflexes: in the upper and lower extremities are symmetric and intact.  Babinski response was deferred.    After spending a total time of 20 minutes face to face with personal review of imaging studies, reports and results of other testing and review of referral information / records as far as provided in visit, I have established the following assessments:  1) OSA on CPAP - highly compliant.  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 His wife is much happier that he is no longer snoring. He averages 7 hours or more of sleep now.  ,snoring and has been sleeping longer and better- no more Hypersomnia,, improved allergies.  He is much less restless and his PLMs have improved , according to spouse .     My Plan is to proceed with: continue use of CPAP at current settings.   I would like to thank  [name], MD- [address] [place],  [place] [address],  for allowing me to meet with and to take care of this pleasant patient.   CC: I will share my notes with PCP and see Dr Benjamin Rivera in 12 month. .  Electronically signed by: Larey Seat, MD 02/24/2021 10:00 AM  [facility] Board certified by The [employer] of Sleep Medicine and Diplomate of the [employer] of Sleep Medicine. Board certified In Neurology through the [place], Fellow of the [employer] of Neurology. Medical Director of [facility].

## 2021-02-24 NOTE — Patient Instructions (Signed)

## 2021-03-02 MED FILL — Citalopram Hydrobromide Tab 40 MG (Base Equiv): ORAL | 90 days supply | Qty: 90 | Fill #0 | Status: AC

## 2021-03-03 ENCOUNTER — Other Ambulatory Visit (HOSPITAL_COMMUNITY): Payer: Self-pay

## 2021-03-19 ENCOUNTER — Other Ambulatory Visit (HOSPITAL_COMMUNITY): Payer: Self-pay

## 2021-04-15 DIAGNOSIS — Z125 Encounter for screening for malignant neoplasm of prostate: Secondary | ICD-10-CM | POA: Diagnosis not present

## 2021-04-15 DIAGNOSIS — E786 Lipoprotein deficiency: Secondary | ICD-10-CM | POA: Diagnosis not present

## 2021-04-27 ENCOUNTER — Other Ambulatory Visit (HOSPITAL_COMMUNITY): Payer: Self-pay

## 2021-04-27 MED FILL — Metoprolol Tartrate Tab 25 MG: ORAL | 90 days supply | Qty: 180 | Fill #0 | Status: AC

## 2021-04-28 DIAGNOSIS — Z1331 Encounter for screening for depression: Secondary | ICD-10-CM | POA: Diagnosis not present

## 2021-04-28 DIAGNOSIS — Z1339 Encounter for screening examination for other mental health and behavioral disorders: Secondary | ICD-10-CM | POA: Diagnosis not present

## 2021-04-28 DIAGNOSIS — Z0000 Encounter for general adult medical examination without abnormal findings: Secondary | ICD-10-CM | POA: Diagnosis not present

## 2021-04-28 DIAGNOSIS — E786 Lipoprotein deficiency: Secondary | ICD-10-CM | POA: Diagnosis not present

## 2021-04-29 ENCOUNTER — Other Ambulatory Visit: Payer: Self-pay | Admitting: Internal Medicine

## 2021-04-29 DIAGNOSIS — E785 Hyperlipidemia, unspecified: Secondary | ICD-10-CM

## 2021-05-13 DIAGNOSIS — R030 Elevated blood-pressure reading, without diagnosis of hypertension: Secondary | ICD-10-CM | POA: Diagnosis not present

## 2021-05-14 DIAGNOSIS — R030 Elevated blood-pressure reading, without diagnosis of hypertension: Secondary | ICD-10-CM | POA: Diagnosis not present

## 2021-05-19 ENCOUNTER — Ambulatory Visit
Admission: RE | Admit: 2021-05-19 | Discharge: 2021-05-19 | Disposition: A | Discharge status: Home / Self Care | Payer: No Typology Code available for payment source | Source: Ambulatory Visit | Attending: Internal Medicine | Admitting: Internal Medicine

## 2021-05-19 ENCOUNTER — Other Ambulatory Visit: Payer: Self-pay

## 2021-05-19 DIAGNOSIS — E785 Hyperlipidemia, unspecified: Secondary | ICD-10-CM

## 2021-06-05 ENCOUNTER — Other Ambulatory Visit (HOSPITAL_COMMUNITY): Payer: Self-pay

## 2021-06-08 ENCOUNTER — Other Ambulatory Visit (HOSPITAL_COMMUNITY): Payer: Self-pay

## 2021-06-08 MED ORDER — CITALOPRAM HYDROBROMIDE 40 MG PO TABS
40.0000 mg | ORAL_TABLET | Freq: Every day | ORAL | 3 refills | Status: DC
  Filled 2021-06-08: qty 90, 90d supply, fill #0
  Filled 2021-09-07: qty 90, 90d supply, fill #1
  Filled 2021-12-08: qty 90, 90d supply, fill #2
  Filled 2022-03-11: qty 90, 90d supply, fill #3

## 2021-06-22 ENCOUNTER — Other Ambulatory Visit (HOSPITAL_COMMUNITY): Payer: Self-pay

## 2021-06-23 DIAGNOSIS — G4733 Obstructive sleep apnea (adult) (pediatric): Secondary | ICD-10-CM | POA: Diagnosis not present

## 2021-07-24 DIAGNOSIS — G4733 Obstructive sleep apnea (adult) (pediatric): Secondary | ICD-10-CM | POA: Diagnosis not present

## 2021-08-23 DIAGNOSIS — G4733 Obstructive sleep apnea (adult) (pediatric): Secondary | ICD-10-CM | POA: Diagnosis not present

## 2021-09-08 ENCOUNTER — Other Ambulatory Visit (HOSPITAL_COMMUNITY): Payer: Self-pay

## 2021-09-22 DIAGNOSIS — G4733 Obstructive sleep apnea (adult) (pediatric): Secondary | ICD-10-CM | POA: Diagnosis not present

## 2021-09-23 DIAGNOSIS — G4733 Obstructive sleep apnea (adult) (pediatric): Secondary | ICD-10-CM | POA: Diagnosis not present

## 2021-09-24 DIAGNOSIS — H5213 Myopia, bilateral: Secondary | ICD-10-CM | POA: Diagnosis not present

## 2021-09-24 DIAGNOSIS — H21231 Degeneration of iris (pigmentary), right eye: Secondary | ICD-10-CM | POA: Diagnosis not present

## 2021-09-28 DIAGNOSIS — J3089 Other allergic rhinitis: Secondary | ICD-10-CM | POA: Diagnosis not present

## 2021-10-02 ENCOUNTER — Other Ambulatory Visit (HOSPITAL_COMMUNITY): Payer: Self-pay

## 2021-10-05 ENCOUNTER — Other Ambulatory Visit (HOSPITAL_COMMUNITY): Payer: Self-pay

## 2021-10-06 ENCOUNTER — Other Ambulatory Visit (HOSPITAL_COMMUNITY): Payer: Self-pay

## 2021-10-06 MED ORDER — METOPROLOL TARTRATE 25 MG PO TABS
ORAL_TABLET | ORAL | 3 refills | Status: DC
  Filled 2021-10-06 – 2021-10-15 (×2): qty 180, 90d supply, fill #0
  Filled 2022-03-11: qty 180, 90d supply, fill #1
  Filled 2022-09-08: qty 180, 90d supply, fill #2

## 2021-10-14 ENCOUNTER — Other Ambulatory Visit (HOSPITAL_COMMUNITY): Payer: Self-pay

## 2021-10-15 ENCOUNTER — Other Ambulatory Visit (HOSPITAL_COMMUNITY): Payer: Self-pay

## 2021-10-23 DIAGNOSIS — G4733 Obstructive sleep apnea (adult) (pediatric): Secondary | ICD-10-CM | POA: Diagnosis not present

## 2021-11-20 DIAGNOSIS — G4733 Obstructive sleep apnea (adult) (pediatric): Secondary | ICD-10-CM | POA: Diagnosis not present

## 2021-12-08 ENCOUNTER — Other Ambulatory Visit (HOSPITAL_COMMUNITY): Payer: Self-pay

## 2021-12-08 MED ORDER — PANTOPRAZOLE SODIUM 40 MG PO TBEC
40.0000 mg | DELAYED_RELEASE_TABLET | Freq: Every day | ORAL | 3 refills | Status: DC
  Filled 2021-12-08: qty 90, 90d supply, fill #0
  Filled 2022-03-11: qty 90, 90d supply, fill #1
  Filled 2022-06-09: qty 90, 90d supply, fill #2
  Filled 2022-09-08: qty 90, 90d supply, fill #3

## 2021-12-31 ENCOUNTER — Other Ambulatory Visit (HOSPITAL_COMMUNITY): Payer: Self-pay

## 2021-12-31 DIAGNOSIS — J4530 Mild persistent asthma, uncomplicated: Secondary | ICD-10-CM | POA: Diagnosis not present

## 2021-12-31 DIAGNOSIS — H1045 Other chronic allergic conjunctivitis: Secondary | ICD-10-CM | POA: Diagnosis not present

## 2021-12-31 DIAGNOSIS — J3089 Other allergic rhinitis: Secondary | ICD-10-CM | POA: Diagnosis not present

## 2021-12-31 MED ORDER — ALBUTEROL SULFATE HFA 108 (90 BASE) MCG/ACT IN AERS
1.0000 | INHALATION_SPRAY | RESPIRATORY_TRACT | 0 refills | Status: DC
  Filled 2021-12-31: qty 18, 17d supply, fill #0

## 2022-01-04 DIAGNOSIS — G4733 Obstructive sleep apnea (adult) (pediatric): Secondary | ICD-10-CM | POA: Diagnosis not present

## 2022-02-03 DIAGNOSIS — G4733 Obstructive sleep apnea (adult) (pediatric): Secondary | ICD-10-CM | POA: Diagnosis not present

## 2022-02-22 ENCOUNTER — Ambulatory Visit: Payer: BC Managed Care – PPO | Admitting: Neurology

## 2022-03-02 ENCOUNTER — Ambulatory Visit: Payer: BC Managed Care – PPO | Admitting: Neurology

## 2022-03-06 DIAGNOSIS — G4733 Obstructive sleep apnea (adult) (pediatric): Secondary | ICD-10-CM | POA: Diagnosis not present

## 2022-03-12 ENCOUNTER — Other Ambulatory Visit (HOSPITAL_COMMUNITY): Payer: Self-pay

## 2022-04-08 ENCOUNTER — Ambulatory Visit (INDEPENDENT_AMBULATORY_CARE_PROVIDER_SITE_OTHER): Payer: BC Managed Care – PPO | Admitting: Neurology

## 2022-04-08 ENCOUNTER — Encounter: Payer: Self-pay | Admitting: Neurology

## 2022-04-08 VITALS — BP 117/80 | HR 61 | Ht 74.0 in | Wt 212.0 lb

## 2022-04-08 DIAGNOSIS — Z9989 Dependence on other enabling machines and devices: Secondary | ICD-10-CM

## 2022-04-08 DIAGNOSIS — G4733 Obstructive sleep apnea (adult) (pediatric): Secondary | ICD-10-CM

## 2022-04-08 NOTE — Progress Notes (Signed)
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 04-08-2022: RLS well controlled, compliant CPAP user-patient is 100% compliant CPAP user current settings are still between 5 and 14 cm water pressure with 3 cm expiratory relief, his residual apnea-hypopnea index is 1.5/h excellent 95th percentile pressure is 9.5 cm water which is equal to the requirements a year ago he denies any excessive fatigue at this point his Epworth Sleepiness Scale was endorsed at 7 out of 24 points.  He may have more nocturia at night.  There have been no significant interval history changes.   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  His 30-day compliance is 97% percent average use at time of 7 hours 50 minutes.  He is using an AutoSet between 5 and 14 cm water pressure with 3 cm expiratory pressure relief.  Residual AHI is 1.8/h which is excellent.  95th percentile leakage is 0.5 L of air per minute which is excellent as well.  The 95th percentile pressure is 9.5 cmH2O and well within the current settings.  Pressure from my tonight has not varied very much and has been only 1 outlier for the AHI and tonight's were elevated and significant air leak has been present.  His Epworth sleepiness score is endorsed at 8 points. RLS is controlled. No snoring on CPAP, sleeping through.        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  6.8/h and was some REM exacerbation but not REM sleep dependence.   There was no unusual cardiac variability by rate nor was there sleep hypoxemia.  And I considered CPAP to be optional but could have also been a dental device used however to see an effect of apnea treatment on the patient's wellbeing would be quickest achieved with CPAP.  There was also an RDI of 8.9 which indicates at least moderate if not loud snoring.  He has not followed up after 3 months of being on CPAP and has been a 93% compliant user.  The average user time is 7 hours 12 minutes the minimum pressure is set at 5 the maximum pressure of 12 cm CPAP with an expiratory pressure relief of 3 cm allowing for easier exhalation.  His residual AHI is 3.1 and is almost equally divided between central and obstructive apneas which is expected in the treatment of mild apnea.  The 95th percentile pressure is 9.6 cmH2O and straddles the upper limit of 12 cm x 3 cm relief.  The patient may do even better with 1 or 2 cm more maximum pressure.  This is an automatic Pap titration which means that if no apneas occur there is no need for the machine to use the higher pressure settings.  He should not have the feeling of swallowing air for getting sinus pressure.  He  feels overall much better and he also stated that his wife sleeps better now that his snoring and apneas are gone his fatigue severity score was endorsed at 23 points and Epworth sleepiness score at 5 points which is a great improvement.  He feels less affected by allergies, his nasal passages have remained clear .  Overall, we will part today with a oxygen of a 2 cm increased in pressure and a refill for his supplies: mask tubing etc.     CONSULTATION :  Chief concern according to patient : I just don't sleep enough and I snore.   I have the pleasure of seeing Benjamin Rivera, Benjamin Rivera today, a right handed White or Caucasian male with a possible sleep disorder.  He  has a past medical history of Allergies, Allergy, Anxiety, Asthma, GERD (gastroesophageal  reflux disease), Headache, adenomatous colonic polyps (03/25/2020), Migraine, and Sleep apnea. Asthma is an allergic manifestation for him. GERD is an active problem. Dr. Janann Rivera has snoring and has been woken by his wife when she noted apnea.  Hypersomnia, mild  Daytime sleepiness, non restorative sleep, need for naps. 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     Sleep relevant medical history: wisdom teeth removed.Family medical /sleep history: Mother and brother used CPAP for OSA. Social history: Patient is working as a Herbalist, [employer].  and lives in a household with wife and 2 children, son is freshman in college, daughter is 102.  The patient currently works and takes call as Benjamin Rivera. Pets are present- one dog. Tobacco use: none .  ETOH use; 2-3 times a week,  Caffeine intake in form of Coffee( 1 in AM  Soda( none ) Tea ( none) nor energy drinks.this has decreased since being on CPAP.  Regular exercise in form of walking 3 miles a day, and playing tennis.     Sleep habits are as follows: The patient's dinner time is variable due to kid's sports, usually between 7-8 PM. The patient goes to bed at 11 PM and is asleep promptly , continues to sleep for several hours, wakes for one  bathroom break, but mostly for snoring and apnea- wife will wake him and tell him that he didn't breathe. The preferred sleep position is prone, with the support of 1-2 pillows.  Had a right shoulder injury and sleeps avoiding the right shoulder. Dreams are reportedly frequent/vivid.  6.30- 7  AM is the usual rise time. The patient wakes up with an alarm. On weekends , he sleeps until 9 AM and he used to be able to sleep 12 hours.  He reports not feeling refreshed or restored in AM, with symptoms such as dry mouth , morning headaches sometimes, and residual fatigue.  Naps are taken on weekends frequently after lunch , lasting from 30-90 minutes and are more refreshing than nocturnal sleep.    Review of  Systems: Out of a complete 14 system review, the patient complains of only the following symptoms, and all other reviewed systems are negative.:  Fatigue, sleepiness , snoring, fragmented sleep, Insomnia- when snoring.   How likely are you to doze in the following situations: 0 = not likely, 1 = slight chance, 2 = moderate chance, 3 = high chance   Sitting and Reading? 2 Watching Television? 1 Sitting inactive in a public place (theater or meeting)? 1 As a passenger in a car for an hour without a break? 2 Lying down in the afternoon when circumstances permit? 2 Sitting and  talking to someone?0 Sitting quietly after lunch without alcohol? 3 In a car, while stopped for a few minutes in traffic?   Total = 7 from  8/ 24 points   FSS endorsed at  23 from 27/ 63 points.   Nocturia times 1.  No snoring.   Social History   Socioeconomic History   Marital status: Married    Spouse name: Not on file   Number of children: 2   Years of education: college   Highest education level: Professional school degree (e.g., Benjamin Rivera, DDS, DVM, JD)  Occupational History   Occupation: [employer] - Benjamin Rivera  Tobacco Use   Smoking status: Never   Smokeless tobacco: Never  Substance and Sexual Activity   Alcohol use: Yes    Comment: 1-2 drinks twice weekly   Drug use: Never   Sexual activity: Not on file  Other Topics Concern   Not on file  Social History Narrative   Lives at home with wife and family.   Right-handed.   1 cup caffeine per day.   Social Determinants of Health   Financial Resource Strain: Not on file  Food Insecurity: Not on file  Transportation Needs: Not on file  Physical Activity: Not on file  Stress: Not on file  Social Connections: Not on file    Family History  Problem Relation Age of Onset   Diabetes Mother    Other Mother        Trigeminal Neuralgia, Meningioma (removal 1990s)   Hypertension Mother    Migraines Mother    Sleep apnea Mother    Crohn's disease  Mother    Asthma Mother    Colon polyps Mother    Heart disease Father    Asthma Brother    Sleep apnea Brother    Heart disease Paternal Grandfather    Prostate cancer Paternal Grandfather    Colon cancer Maternal Grandmother    Esophageal cancer Neg Hx    Stomach cancer Neg Hx    Rectal cancer Neg Hx     Past Medical History:  Diagnosis Date   Allergies    Allergy    Anxiety    Asthma    GERD (gastroesophageal reflux disease)    Headache    Hx of adenomatous colonic polyps 03/25/2020   Migraine    Sleep apnea    not currently using cpap    Past Surgical History:  Procedure Laterality Date   shoulder surgery  2012   VEIN SURGERY  2003   [place] EXTRACTION  2001     Current Outpatient Medications on File Prior to Visit  Medication Sig Dispense Refill   albuterol (VENTOLIN HFA) 108 (90 Base) MCG/ACT inhaler Inhale 1-2 puffs into the lungs every 4-6 hours as needed for cough/wheeze 18 g 0   citalopram (CELEXA) 40 MG tablet Take 1 tablet (40 mg total) by mouth daily. 90 tablet 3   fexofenadine (ALLEGRA ALLERGY) 180 MG tablet 1 tablet     fluticasone (FLONASE) 50 MCG/ACT nasal spray Place 1 spray into both nostrils daily.     MAGNESIUM PO Take 500 mg by mouth daily.     metoprolol tartrate (LOPRESSOR) 25 MG tablet TAKE 1 TABLET BY MOUTH DAILY, THEN INCREASE TO TAKE 1 TABLET BY MOUTH TWO TIMES DAILY AS NEEDED 180 tablet 3   Multiple Vitamin (MULTIVITAMIN) tablet Take 1 tablet by mouth daily.     pantoprazole (PROTONIX) 40 MG tablet Take 1 tablet (40 mg total) by mouth daily.  90 tablet 3   VITAMIN D PO Take 2,000 Units by mouth daily.     citalopram (CELEXA) 40 MG tablet TAKE 1 TABLET BY MOUTH DAILY 90 tablet 5   metoprolol tartrate (LOPRESSOR) 25 MG tablet TAKE 1 TABLET BY MOUTH DAILY, THEN INCREASE TO TAKE 1 TABLET BY MOUTH TWO TIMES DAILY AS NEEDED 180 tablet 3   No current facility-administered medications on file prior to visit.    Allergies  Allergen Reactions    Amoxicillin Hives   Cephalosporins Hives   Cephalosporins Other (See Comments)   Penicillins Other (See Comments)    Physical exam:  Today's Vitals   04/08/22 1116  BP: 117/80  Pulse: 61  Weight: 212 lb (96.2 kg)  Height: '6\' 2"'$  (1.88 m)   Body mass index is 27.22 kg/m.   Wt Readings from Last 3 Encounters:  04/08/22 212 lb (96.2 kg)  02/24/21 206 lb (93.4 kg)  05/26/20 209 lb (94.8 kg)     Ht Readings from Last 3 Encounters:  04/08/22 '6\' 2"'$  (1.88 m)  02/24/21 '6\' 2"'$  (1.88 m)  05/26/20 '6\' 2"'$  (1.88 m)      General: The patient is awake, alert and appears not in acute distress. The patient is well groomed. Head: Normocephalic, atraumatic. Neck is supple. Mallampati 1, red soft palate.  neck circumference: 16.00 inches . BMI 27.  Nasal airflow patent.  Retrognathia is seen.  Dental status: intact . Intact- Lower jaw.  Cardiovascular:  Regular rate and cardiac rhythm by pulse,  without distended neck veins. Respiratory: Lungs are clear to auscultation.  Skin:  Without evidence of ankle edema, or rash. Trunk: The patient's posture is erect.   Neurologic exam : The patient is awake and alert, oriented to place and time.   Memory subjective described as intact.  Attention span & concentration ability appears normal.  Speech is fluent, appropriate.   Cranial nerves: no loss of smell or taste reported - vaccinated .  Pupils are equal and briskly reactive to light. Funduscopic exam deferred.  Wears glasses.  Extraocular movements in vertical and horizontal planes were intact and with horizontal endpoint  nystagmus. Facial motor strength is symmetric - tongue and uvula move midline.  Neck ROM : rotation, tilt and flexion extension were normal for age and shoulder shrug was symmetrical.       After spending a total time of 10 minutes face to face with personal review of imaging studies, reports and results of other testing and review of referral information / records as far  as provided in visit, I have established the following assessments:  1) OSA on CPAP - highly compliant. 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 His wife is much happier that he is no longer snoring. He averages 7 hours or more of sleep now.  ,snoring and has been sleeping longer and better- no more Hypersomnia,, improved allergies.  He is much less restless and his PLMs have improved , according to spouse .     My Plan is to proceed with: continue use of CPAP at current settings.     I would like to thank  Benjamin Rivera, Benjamin Rivera- [address] [place],  [place] [address],  , for allowing me to meet with and to take care of this pleasant patient.   CC: I will share my notes with PCP and see Dr Benjamin Rivera in 12 month , a virtual visit will be offered.    Electronically signed by: Benjamin Rivera, Benjamin Rivera  04/08/2022 11:59 AM  [facility] and [employer] certified by [employer] of Sleep Medicine and Diplomate of the [employer] of Sleep Medicine. Board certified In Neurology through the [employer], Fellow of the [employer] of Neurology. Medical Director of [employer].

## 2022-04-08 NOTE — Patient Instructions (Signed)

## 2022-04-19 DIAGNOSIS — G4733 Obstructive sleep apnea (adult) (pediatric): Secondary | ICD-10-CM | POA: Diagnosis not present

## 2022-05-20 DIAGNOSIS — G4733 Obstructive sleep apnea (adult) (pediatric): Secondary | ICD-10-CM | POA: Diagnosis not present

## 2022-05-26 DIAGNOSIS — J3089 Other allergic rhinitis: Secondary | ICD-10-CM | POA: Diagnosis not present

## 2022-06-04 DIAGNOSIS — R7301 Impaired fasting glucose: Secondary | ICD-10-CM | POA: Diagnosis not present

## 2022-06-04 DIAGNOSIS — E785 Hyperlipidemia, unspecified: Secondary | ICD-10-CM | POA: Diagnosis not present

## 2022-06-04 DIAGNOSIS — Z125 Encounter for screening for malignant neoplasm of prostate: Secondary | ICD-10-CM | POA: Diagnosis not present

## 2022-06-09 ENCOUNTER — Other Ambulatory Visit (HOSPITAL_COMMUNITY): Payer: Self-pay

## 2022-06-09 MED ORDER — CITALOPRAM HYDROBROMIDE 40 MG PO TABS
40.0000 mg | ORAL_TABLET | Freq: Every day | ORAL | 4 refills | Status: DC
  Filled 2022-06-09: qty 90, 90d supply, fill #0
  Filled 2022-09-08: qty 90, 90d supply, fill #1
  Filled 2022-12-03: qty 90, 90d supply, fill #2
  Filled 2023-03-14: qty 90, 90d supply, fill #3
  Filled 2023-06-08: qty 90, 90d supply, fill #4

## 2022-06-11 ENCOUNTER — Other Ambulatory Visit (HOSPITAL_COMMUNITY): Payer: Self-pay

## 2022-06-11 DIAGNOSIS — Z1331 Encounter for screening for depression: Secondary | ICD-10-CM | POA: Diagnosis not present

## 2022-06-11 DIAGNOSIS — E786 Lipoprotein deficiency: Secondary | ICD-10-CM | POA: Diagnosis not present

## 2022-06-11 DIAGNOSIS — Z0000 Encounter for general adult medical examination without abnormal findings: Secondary | ICD-10-CM | POA: Diagnosis not present

## 2022-06-11 DIAGNOSIS — R82998 Other abnormal findings in urine: Secondary | ICD-10-CM | POA: Diagnosis not present

## 2022-06-11 DIAGNOSIS — Z1389 Encounter for screening for other disorder: Secondary | ICD-10-CM | POA: Diagnosis not present

## 2022-06-11 MED ORDER — CITALOPRAM HYDROBROMIDE 40 MG PO TABS
40.0000 mg | ORAL_TABLET | Freq: Every day | ORAL | 3 refills | Status: DC
  Filled 2022-06-11: qty 90, 90d supply, fill #0

## 2022-06-19 DIAGNOSIS — G4733 Obstructive sleep apnea (adult) (pediatric): Secondary | ICD-10-CM | POA: Diagnosis not present

## 2022-08-12 ENCOUNTER — Ambulatory Visit: Payer: BC Managed Care – PPO | Admitting: Cardiology

## 2022-08-20 DIAGNOSIS — G4733 Obstructive sleep apnea (adult) (pediatric): Secondary | ICD-10-CM | POA: Diagnosis not present

## 2022-09-08 ENCOUNTER — Other Ambulatory Visit (HOSPITAL_COMMUNITY): Payer: Self-pay

## 2022-09-08 MED ORDER — DOXYCYCLINE MONOHYDRATE 100 MG PO CAPS
100.0000 mg | ORAL_CAPSULE | Freq: Two times a day (BID) | ORAL | 0 refills | Status: DC
  Filled 2022-09-08: qty 14, 7d supply, fill #0

## 2022-09-08 MED ORDER — PREDNISONE 5 MG PO TABS
ORAL_TABLET | ORAL | 0 refills | Status: AC
  Filled 2022-09-08: qty 21, 6d supply, fill #0

## 2022-09-09 ENCOUNTER — Other Ambulatory Visit (HOSPITAL_COMMUNITY): Payer: Self-pay

## 2022-09-20 DIAGNOSIS — G4733 Obstructive sleep apnea (adult) (pediatric): Secondary | ICD-10-CM | POA: Diagnosis not present

## 2022-09-22 NOTE — Progress Notes (Unsigned)
ID:  Benjamin Frames, MD, DOB April 17, 1971, MRN [id number]  PCP:  [name], MD  Cardiologist:  Rex Kras, DO, [facility] (established care 09/23/2022) Former Cardiology Providers: ***  REASON FOR CONSULT: Chest Pain  REQUESTING PHYSICIAN:  [name], MD [address] [place],  [place] [address]  No chief complaint on file.   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 His past medical history and cardiovascular risk factors include: Ocular migraines, GERD, chronic venous insufficiency, mild sleep apnea/RLS as of 2021, ***  to dr Einar Gip for some exertional cp in the last few weeks.  0 cac score a year ago but may need a plain old treadmill test or other test.  diagnosis: chest pain, unspecified type ***   History of  Denies prior history of coronary artery disease, myocardial infarction, congestive heart failure, deep venous thrombosis, pulmonary embolism, stroke, transient ischemic attack.  FUNCTIONAL STATUS: ***   ALLERGIES: Allergies  Allergen Reactions   Amoxicillin Hives   Cephalosporins Hives   Cephalosporins Other (See Comments)   Penicillins Other (See Comments)    MEDICATION LIST PRIOR TO VISIT: No outpatient medications have been marked as taking for the 09/23/22 encounter (Appointment) with Rex Kras, DO.     PAST MEDICAL HISTORY: Past Medical History:  Diagnosis Date   Allergies    Allergy    Anxiety    Asthma    GERD (gastroesophageal reflux disease)    Headache    Hx of adenomatous colonic polyps 03/25/2020   Migraine    Sleep apnea    not currently using cpap    PAST SURGICAL HISTORY: Past Surgical History:  Procedure Laterality Date   shoulder surgery  2012   VEIN SURGERY  2003   WISDOM TOOTH EXTRACTION  2001    FAMILY HISTORY: The patient family history includes Asthma in his brother and mother; Colon cancer in his maternal grandmother; Colon  polyps in his mother; Crohn's disease in his mother; Diabetes in his mother; Heart disease in his father and paternal grandfather; Hypertension in his mother; Migraines in his mother; Other in his mother; Prostate cancer in his paternal grandfather; Sleep apnea in his brother and mother.  SOCIAL HISTORY:  The patient  reports that he has never smoked. He has never used smokeless tobacco. He reports current alcohol use. He reports that he does not use drugs.  REVIEW OF SYSTEMS: ROS  PHYSICAL EXAM:    04/08/2022   11:16 AM 02/24/2021    9:23 AM 05/26/2020    8:15 AM  Vitals with BMI  Height '6\' 2"'$  '6\' 2"'$  '6\' 2"'$   Weight 212 lbs 206 lbs 209 lbs  BMI 27.21 62.70 35.00  Systolic 938 182 993  Diastolic 80 83 86  Pulse 61 64 67    Physical Exam   CARDIAC DATABASE: EKG: ***  Echocardiogram: No results found for this or any previous visit from the past 1095 days.    Stress Testing: No results found for this or any previous visit from the past 1095 days.   Heart Catheterization: None  CT Cardiac Scoring: 05/19/2021 Total CAC  0 AU. Noncardiac findings: none per report.   LABORATORY DATA: External Labs: Collected: 06/04/2022 Sodium 138, potassium 4, chloride 102, bicarb 27. BUN 16, creatinine 0.9. eGFR >60 mL/min. AST 16, ALT 15, alkaline phosphatase 95. Hemoglobin 14.7, hematocrit 38.8%. Total cholesterol 166, triglyceride 141,  HDL 44, LDL 94, non-HDL 122. TSH 0.99. A1c 5.1  IMPRESSION:  No diagnosis found.   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 *** male whose past medical history and cardiac risk factors include: ***   FINAL MEDICATION LIST END OF ENCOUNTER: No orders of the defined types were placed in this encounter.   There are no discontinued medications.   Current Outpatient Medications:    albuterol (VENTOLIN HFA) 108 (90 Base) MCG/ACT inhaler, Inhale 1-2 puffs into the lungs every 4-6 hours as needed for cough/wheeze, Disp: 18 g, Rfl: 0    citalopram (CELEXA) 40 MG tablet, TAKE 1 TABLET BY MOUTH DAILY, Disp: 90 tablet, Rfl: 4   citalopram (CELEXA) 40 MG tablet, Take 1 tablet (40 mg total) by mouth daily., Disp: 90 tablet, Rfl: 3   doxycycline (MONODOX) 100 MG capsule, Take 1 capsule (100 mg total) by mouth 2 (two) times daily, for 7 days, Disp: 14 capsule, Rfl: 0   fexofenadine (ALLEGRA ALLERGY) 180 MG tablet, 1 tablet, Disp: , Rfl:    fluticasone (FLONASE) 50 MCG/ACT nasal spray, Place 1 spray into both nostrils daily., Disp: , Rfl:    MAGNESIUM PO, Take 500 mg by mouth daily., Disp: , Rfl:    metoprolol tartrate (LOPRESSOR) 25 MG tablet, TAKE 1 TABLET BY MOUTH DAILY, THEN INCREASE TO TAKE 1 TABLET BY MOUTH TWO TIMES DAILY AS NEEDED, Disp: 180 tablet, Rfl: 3   metoprolol tartrate (LOPRESSOR) 25 MG tablet, TAKE 1 TABLET BY MOUTH DAILY, THEN INCREASE TO TAKE 1 TABLET BY MOUTH TWO TIMES DAILY AS NEEDED, Disp: 180 tablet, Rfl: 3   Multiple Vitamin (MULTIVITAMIN) tablet, Take 1 tablet by mouth daily., Disp: , Rfl:    pantoprazole (PROTONIX) 40 MG tablet, Take 1 tablet (40 mg total) by mouth daily., Disp: 90 tablet, Rfl: 3   VITAMIN D PO, Take 2,000 Units by mouth daily., Disp: , Rfl:   No orders of the defined types were placed in this encounter.   There are no Patient Instructions on file for this visit.   --Continue cardiac medications as reconciled in final medication list. --No follow-ups on file. or sooner if needed. --Continue follow-up with your primary care physician regarding the management of your other chronic comorbid conditions.  Patient's questions and concerns were addressed to his satisfaction. He voices understanding of the instructions provided during this encounter.   This note was created using a voice recognition software as a result there may be grammatical errors inadvertently enclosed that do not reflect the nature of this encounter. Every attempt is made to correct such errors.  Rex Kras, [place],  [facility]  Pager: [phone] Office: [phone]

## 2022-09-23 ENCOUNTER — Encounter: Payer: Self-pay | Admitting: Cardiology

## 2022-09-23 ENCOUNTER — Other Ambulatory Visit (HOSPITAL_COMMUNITY): Payer: Self-pay

## 2022-09-23 ENCOUNTER — Ambulatory Visit: Payer: BC Managed Care – PPO | Admitting: Cardiology

## 2022-09-23 VITALS — BP 130/84 | HR 81 | Ht 73.0 in | Wt 217.4 lb

## 2022-09-23 DIAGNOSIS — G4730 Sleep apnea, unspecified: Secondary | ICD-10-CM | POA: Diagnosis not present

## 2022-09-23 DIAGNOSIS — R072 Precordial pain: Secondary | ICD-10-CM | POA: Diagnosis not present

## 2022-09-23 MED ORDER — NITROGLYCERIN 0.4 MG SL SUBL
0.4000 mg | SUBLINGUAL_TABLET | SUBLINGUAL | 0 refills | Status: AC | PRN
  Filled 2022-09-23: qty 25, 10d supply, fill #0

## 2022-09-24 ENCOUNTER — Ambulatory Visit: Payer: BC Managed Care – PPO

## 2022-09-24 DIAGNOSIS — R072 Precordial pain: Secondary | ICD-10-CM

## 2022-09-27 NOTE — Progress Notes (Signed)
LVM to call back for results.

## 2022-09-29 NOTE — Progress Notes (Signed)
Patient aware

## 2022-09-29 NOTE — Progress Notes (Signed)
2nd attempt : Called patient, NA, LMAM

## 2022-10-01 DIAGNOSIS — R072 Precordial pain: Secondary | ICD-10-CM | POA: Diagnosis not present

## 2022-10-02 LAB — BASIC METABOLIC PANEL
BUN/Creatinine Ratio: 15 (ref 9–20)
BUN: 14 mg/dL (ref 6–24)
CO2: 23 mmol/L (ref 20–29)
Calcium: 9.3 mg/dL (ref 8.7–10.2)
Chloride: 98 mmol/L (ref 96–106)
Creatinine, Ser: 0.92 mg/dL (ref 0.76–1.27)
Glucose: 90 mg/dL (ref 70–99)
Potassium: 4.6 mmol/L (ref 3.5–5.2)
Sodium: 137 mmol/L (ref 134–144)
eGFR: 100 mL/min/{1.73_m2} (ref >59)

## 2022-10-06 ENCOUNTER — Telehealth (HOSPITAL_COMMUNITY): Payer: Self-pay | Admitting: Emergency Medicine

## 2022-10-06 NOTE — Telephone Encounter (Signed)
Detailed message left on vm per patient request. Marchia Bond RN Navigator Cardiac Imaging [facility] V[facility] Services [phone] Office  [phone] Cell

## 2022-10-06 NOTE — Telephone Encounter (Signed)
Attempted to call patient regarding upcoming cardiac CT appointment. Left message on voicemail with name and callback number Marchia Bond RN Navigator Cardiac Section Heart and Vascular Services [phone] Office [phone] Cell

## 2022-10-07 ENCOUNTER — Ambulatory Visit (HOSPITAL_COMMUNITY)
Admission: RE | Admit: 2022-10-07 | Discharge: 2022-10-07 | Disposition: A | Discharge status: Home / Self Care | Payer: BC Managed Care – PPO | Source: Ambulatory Visit | Attending: Cardiology | Admitting: Cardiology

## 2022-10-07 DIAGNOSIS — R072 Precordial pain: Secondary | ICD-10-CM | POA: Insufficient documentation

## 2022-10-07 MED ORDER — NITROGLYCERIN 0.4 MG SL SUBL
0.8000 mg | SUBLINGUAL_TABLET | Freq: Once | SUBLINGUAL | Status: AC
  Administered 2022-10-07: 0.8 mg via SUBLINGUAL

## 2022-10-07 MED ORDER — NITROGLYCERIN 0.4 MG SL SUBL
SUBLINGUAL_TABLET | SUBLINGUAL | Status: AC
  Filled 2022-10-07: qty 2

## 2022-10-07 MED ORDER — IOHEXOL 350 MG/ML SOLN
100.0000 mL | Freq: Once | INTRAVENOUS | Status: AC | PRN
  Administered 2022-10-07: 100 mL via INTRAVENOUS

## 2022-10-10 DIAGNOSIS — Q2112 Patent foramen ovale: Secondary | ICD-10-CM | POA: Diagnosis not present

## 2022-10-10 DIAGNOSIS — R079 Chest pain, unspecified: Secondary | ICD-10-CM | POA: Diagnosis not present

## 2022-10-10 DIAGNOSIS — R072 Precordial pain: Secondary | ICD-10-CM | POA: Insufficient documentation

## 2022-10-21 DIAGNOSIS — G4733 Obstructive sleep apnea (adult) (pediatric): Secondary | ICD-10-CM | POA: Diagnosis not present

## 2022-10-29 NOTE — Progress Notes (Signed)
LMTCB

## 2022-11-02 NOTE — Progress Notes (Signed)
LMTCB

## 2022-11-03 NOTE — Progress Notes (Signed)
3rd attempt : Called patient, Benjamin Rivera, LMAM

## 2022-11-23 DIAGNOSIS — G4733 Obstructive sleep apnea (adult) (pediatric): Secondary | ICD-10-CM | POA: Diagnosis not present

## 2022-12-03 ENCOUNTER — Other Ambulatory Visit: Payer: Self-pay

## 2022-12-03 ENCOUNTER — Other Ambulatory Visit (HOSPITAL_COMMUNITY): Payer: Self-pay

## 2022-12-07 ENCOUNTER — Other Ambulatory Visit (HOSPITAL_COMMUNITY): Payer: Self-pay

## 2022-12-07 MED ORDER — PANTOPRAZOLE SODIUM 40 MG PO TBEC
40.0000 mg | DELAYED_RELEASE_TABLET | Freq: Every day | ORAL | 4 refills | Status: DC
  Filled 2022-12-07: qty 90, 90d supply, fill #0
  Filled 2023-03-14: qty 90, 90d supply, fill #1
  Filled 2023-06-08: qty 90, 90d supply, fill #2
  Filled 2023-09-04: qty 90, 90d supply, fill #3

## 2022-12-13 ENCOUNTER — Encounter: Payer: Self-pay | Admitting: Internal Medicine

## 2022-12-24 DIAGNOSIS — G4733 Obstructive sleep apnea (adult) (pediatric): Secondary | ICD-10-CM | POA: Diagnosis not present

## 2022-12-27 ENCOUNTER — Other Ambulatory Visit (HOSPITAL_COMMUNITY): Payer: Self-pay

## 2022-12-27 MED ORDER — DOXYCYCLINE MONOHYDRATE 100 MG PO CAPS
100.0000 mg | ORAL_CAPSULE | Freq: Two times a day (BID) | ORAL | 0 refills | Status: DC
  Filled 2022-12-27: qty 20, 10d supply, fill #0

## 2023-01-05 DIAGNOSIS — J4530 Mild persistent asthma, uncomplicated: Secondary | ICD-10-CM | POA: Diagnosis not present

## 2023-01-05 DIAGNOSIS — H1045 Other chronic allergic conjunctivitis: Secondary | ICD-10-CM | POA: Diagnosis not present

## 2023-01-05 DIAGNOSIS — J3089 Other allergic rhinitis: Secondary | ICD-10-CM | POA: Diagnosis not present

## 2023-01-11 IMAGING — CT CT CARDIAC CORONARY ARTERY CALCIUM SCORE
3 series · 14 of 20 positions shown, 16 images · non-contrast
Comparison: None.

CLINICAL DATA: 49-year-old Caucasian male with elevated cholesterol
and family history of heart disease.

EXAM:
CT CARDIAC CORONARY ARTERY CALCIUM SCORE
TECHNIQUE: Non-contrast imaging through the heart was performed using
prospective ECG gating. Image post processing was performed on an
independent workstation, allowing for quantitative analysis of the
heart and coronary arteries. Note that this exam targets the heart
and the chest was not imaged in its entirety.

[Series 2: calcium scoring 2.00 qr36 bestdiast 70% hrt calciu · axial · 0.46mm/px · z∈[+1692,+1776]mm · 4 of 70 slices shown]
[im 14/70  vessel]
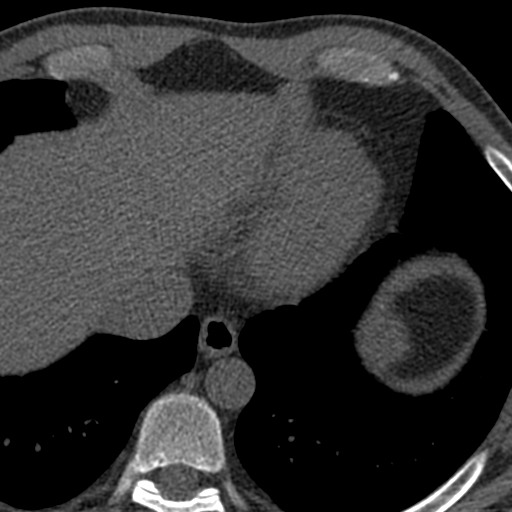
[im 28/70  vessel]
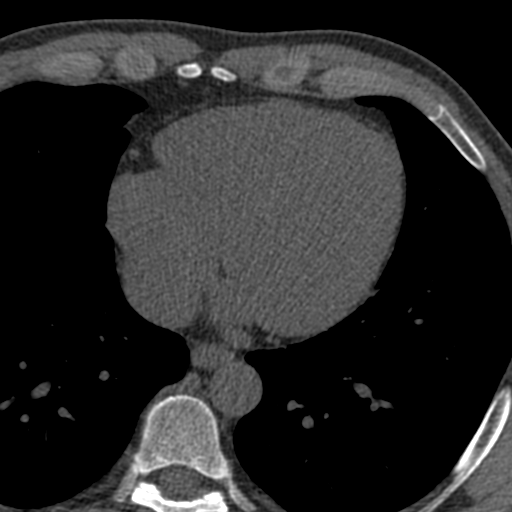
[im 42/70  vessel]
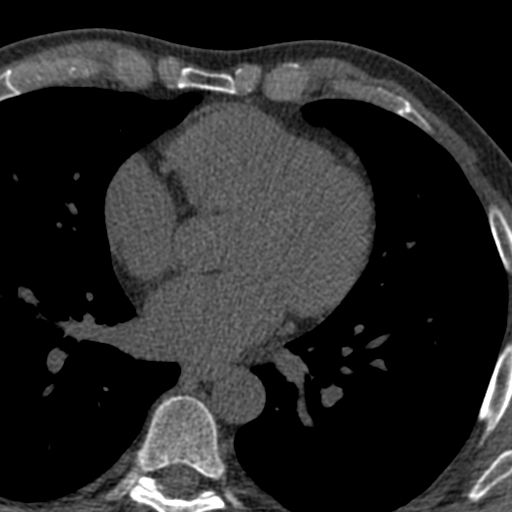
[im 56/70  vessel]
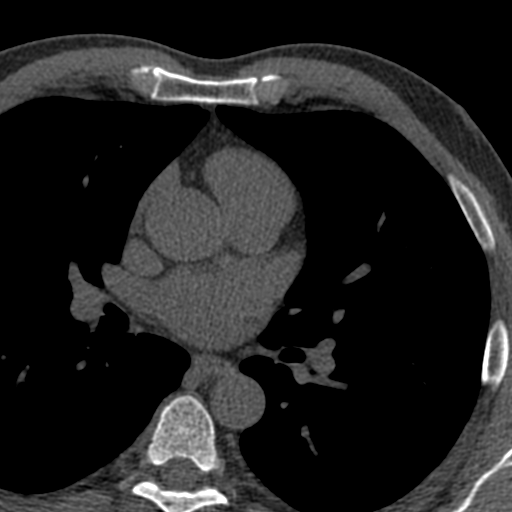

[Series 3: calcium scoring 2.00 br40 bestdiast 70% axial · axial · 0.65mm/px · z∈[+1688,+1780]mm · 5 of 70 slices shown, 7 images]
[im 12/70  vessel]
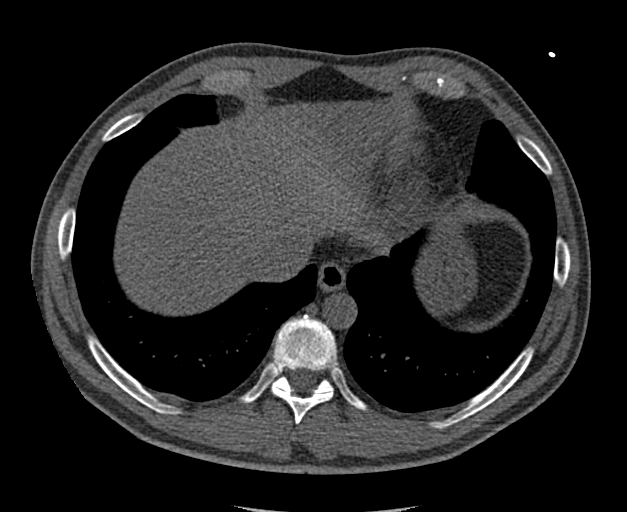
[im 12/70  lung]
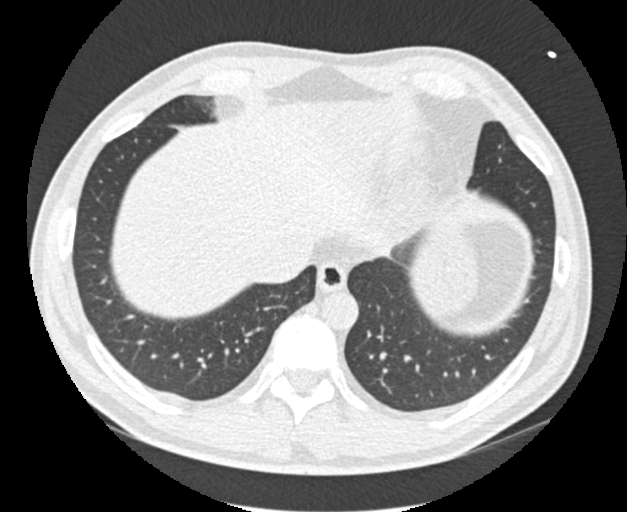
[im 24/70  vessel]
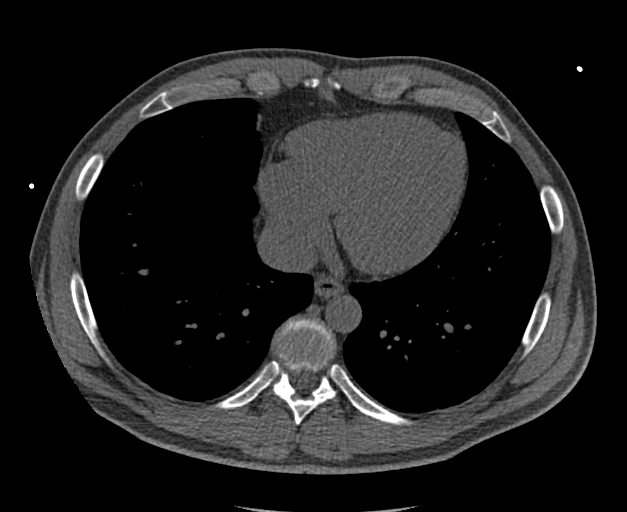
[im 35/70  vessel]
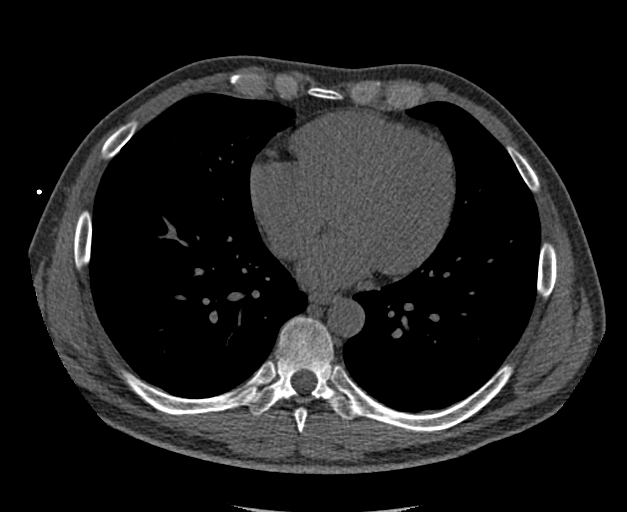
[im 47/70  vessel]
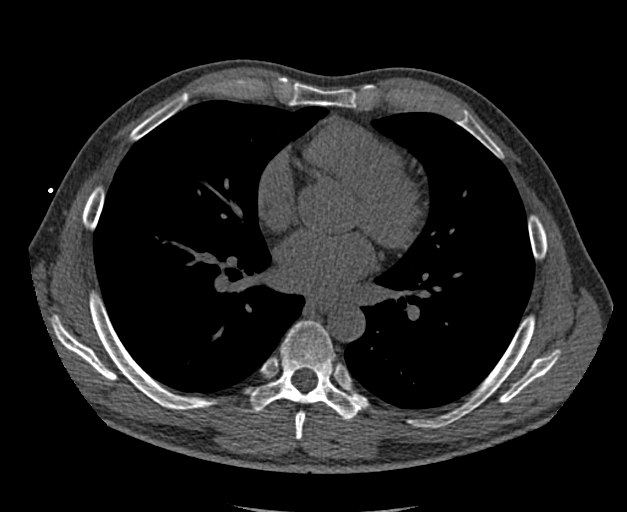
[im 58/70  vessel]
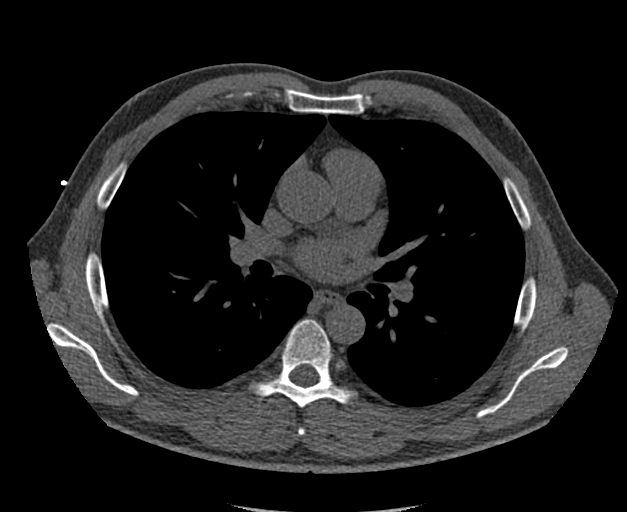
[im 58/70  lung]
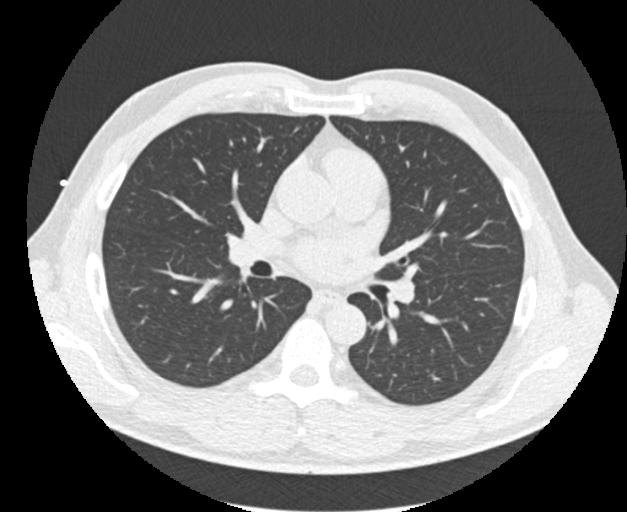

[Series 9: calcium scoring 2.00 br60 bestdiast 70% lungs · axial · 0.65mm/px · z∈[+1688,+1780]mm · 5 of 70 slices shown]
[im 12/70  vessel]
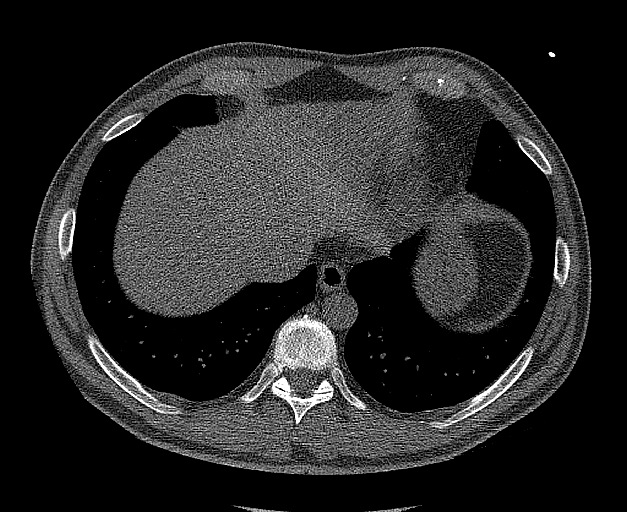
[im 24/70  vessel]
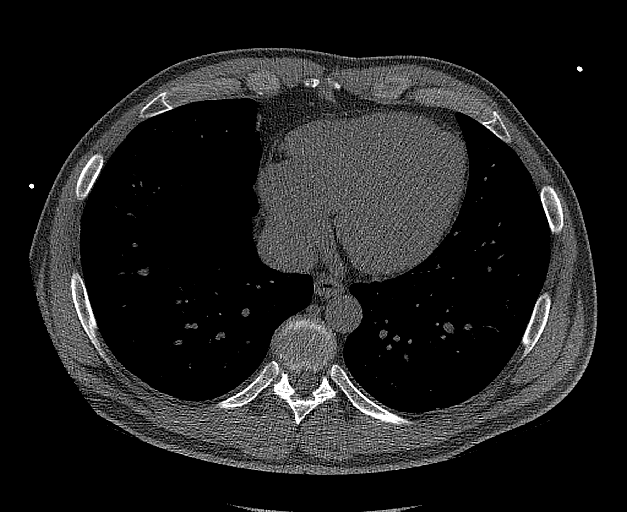
[im 35/70  vessel]
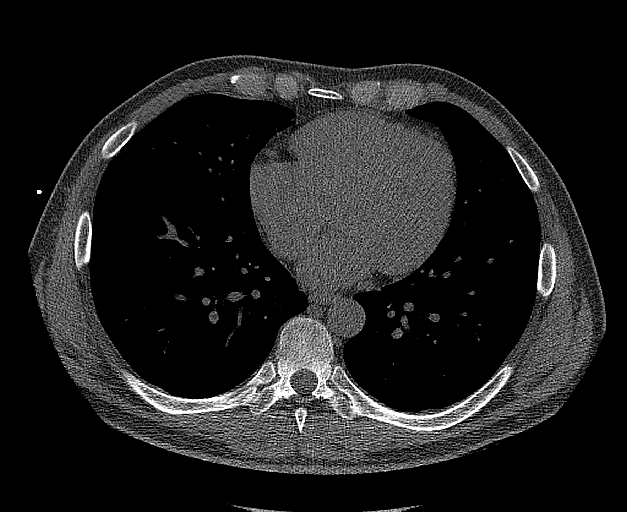
[im 47/70  vessel]
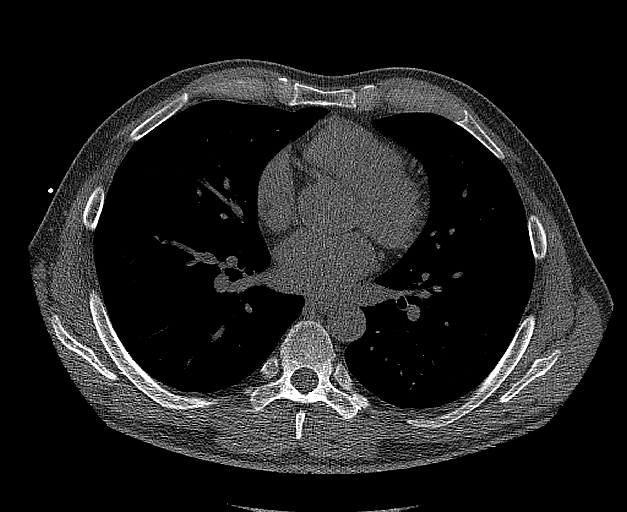
[im 58/70  vessel]
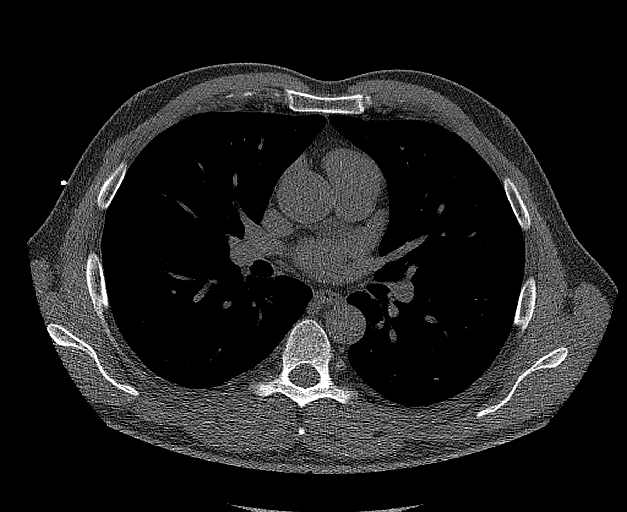

[14 of 20 positions shown; findings below may reference images not displayed]

FINDINGS: CORONARY CALCIUM SCORES:

Left Main: 0

LAD: 0

LCx: 0

RCA: 0

Total Agatston Score: 0

[HOSPITAL] percentile: 0

AORTA MEASUREMENTS:

Ascending Aorta: 33 mm

Descending Aorta: 24 mm

OTHER FINDINGS:

The heart size is within normal limits. No pericardial fluid is
identified. Visualized segments of the thoracic aorta and central
pulmonary arteries are normal in caliber. Visualized mediastinum and
hilar regions demonstrate no lymphadenopathy or masses. Visualized
lungs show no evidence of pulmonary edema, consolidation,
pneumothorax, nodule or pleural fluid. Visualized upper abdomen and
bony structures are unremarkable.
IMPRESSION: 1. Coronary calcium score of 0.
2. No significant incidental findings in the visualized chest.

## 2023-01-23 DIAGNOSIS — G4733 Obstructive sleep apnea (adult) (pediatric): Secondary | ICD-10-CM | POA: Diagnosis not present

## 2023-02-17 DIAGNOSIS — J3089 Other allergic rhinitis: Secondary | ICD-10-CM | POA: Diagnosis not present

## 2023-02-18 ENCOUNTER — Ambulatory Visit (AMBULATORY_SURGERY_CENTER): Payer: BC Managed Care – PPO

## 2023-02-18 VITALS — Ht 73.0 in | Wt 210.0 lb

## 2023-02-18 DIAGNOSIS — Z86010 Personal history of colonic polyps: Secondary | ICD-10-CM

## 2023-02-18 NOTE — Progress Notes (Signed)
No egg or soy allergy known to patient  No issues known to pt with past sedation with any surgeries or procedures Patient denies ever being told they had issues or difficulty with intubation  No FH of Malignant Hyperthermia Pt is not on diet pills Pt is not on  home 02  Pt is not on blood thinners  Pt denies issues with constipation  No A fib or A flutter Have any cardiac testing pending--no  Pt is ambulatory   PV completed. Prep reviewed and sent via mychart / home address. Pt understand all the highlighted areas should be read when the packet is received and to call the office back if there are any questions. Miralax prep given pt to purchase all products OTC.    Patient's chart reviewed by Benjamin Rivera CNRA prior to previsit and patient appropriate for the [facility].  Previsit completed and red dot placed by patient's name on their procedure day (on provider's schedule).

## 2023-03-01 ENCOUNTER — Encounter: Payer: Self-pay | Admitting: Internal Medicine

## 2023-03-05 ENCOUNTER — Other Ambulatory Visit (HOSPITAL_COMMUNITY): Payer: Self-pay

## 2023-03-07 DIAGNOSIS — G4733 Obstructive sleep apnea (adult) (pediatric): Secondary | ICD-10-CM | POA: Diagnosis not present

## 2023-03-14 ENCOUNTER — Other Ambulatory Visit: Payer: Self-pay

## 2023-03-16 ENCOUNTER — Encounter: Payer: Self-pay | Admitting: Internal Medicine

## 2023-03-16 ENCOUNTER — Ambulatory Visit (AMBULATORY_SURGERY_CENTER): Payer: BC Managed Care – PPO | Admitting: Internal Medicine

## 2023-03-16 VITALS — BP 120/77 | HR 64 | Temp 97.7°F | Resp 11 | Ht 73.0 in | Wt 210.0 lb

## 2023-03-16 DIAGNOSIS — Z86010 Personal history of colonic polyps: Secondary | ICD-10-CM | POA: Diagnosis not present

## 2023-03-16 DIAGNOSIS — D123 Benign neoplasm of transverse colon: Secondary | ICD-10-CM | POA: Diagnosis not present

## 2023-03-16 DIAGNOSIS — Z09 Encounter for follow-up examination after completed treatment for conditions other than malignant neoplasm: Secondary | ICD-10-CM | POA: Diagnosis not present

## 2023-03-16 DIAGNOSIS — D122 Benign neoplasm of ascending colon: Secondary | ICD-10-CM

## 2023-03-16 DIAGNOSIS — K635 Polyp of colon: Secondary | ICD-10-CM | POA: Diagnosis not present

## 2023-03-16 DIAGNOSIS — Z1211 Encounter for screening for malignant neoplasm of colon: Secondary | ICD-10-CM | POA: Diagnosis not present

## 2023-03-16 MED ORDER — SODIUM CHLORIDE 0.9 % IV SOLN: 500.0000 mL | Freq: Once | INTRAVENOUS | Status: DC

## 2023-03-16 NOTE — Progress Notes (Signed)
Pt's states no medical or surgical changes since previsit or office visit. 

## 2023-03-16 NOTE — Progress Notes (Signed)
Sedate, gd SR, tolerated procedure well, VSS, report to RN 

## 2023-03-16 NOTE — Op Note (Signed)
[facility] Patient Name: Benjamin Rivera Procedure Date: 03/16/2023 1:14 PM MRN: [id number] Endoscopist: Iva Boop , MD, [id number] Age: 52 Referring MD:  Date of Birth: 11/07/1970 Gender: Male Account #: [id number] Procedure:                Colonoscopy Indications:              Surveillance: Personal history of adenomatous                            polyps on last colonoscopy 3 years ago, Last                            colonoscopy: July 2021 Medicines:                Monitored Anesthesia Care Procedure:                Pre-Anesthesia Assessment:                           - Prior to the procedure, a History and Physical                            was performed, and patient medications and                            allergies were reviewed. The patient's tolerance of                            previous anesthesia was also reviewed. The risks                            and benefits of the procedure and the sedation                            options and risks were discussed with the patient.                            All questions were answered, and informed consent                            was obtained. Prior Anticoagulants: The patient has                            taken no anticoagulant or antiplatelet agents. ASA                            Grade Assessment: II - A patient with mild systemic                            disease. After reviewing the risks and benefits,                            the patient was deemed in satisfactory condition to  undergo the procedure.                           After obtaining informed consent, the colonoscope                            was passed under direct vision. Throughout the                            procedure, the patient's blood pressure, pulse, and                            oxygen saturations were monitored continuously. The                            CF [id number] #[id number] was introduced through the  anus                            and advanced to the the cecum, identified by                            appendiceal orifice and ileocecal valve. The                            colonoscopy was performed without difficulty. The                            patient tolerated the procedure well. The quality                            of the bowel preparation was good. The ileocecal                            valve, appendiceal orifice, and rectum were                            photographed. The bowel preparation used was                            Miralax via split dose instruction. Scope In: 1:33:29 PM Scope Out: 1:56:25 PM Scope Withdrawal Time: 0 hours 19 minutes 7 seconds  Total Procedure Duration: 0 hours 22 minutes 56 seconds  Findings:                 The perianal and digital rectal examinations were                            normal. Pertinent negatives include normal prostate                            (size, shape, and consistency).                           Six sessile polyps were found in the transverse  colon and ascending colon. The polyps were                            diminutive in size. These polyps were removed with                            a cold snare. Resection and retrieval were                            complete. Verification of patient identification                            for the specimen was done. Estimated blood loss was                            minimal.                           The exam was otherwise without abnormality on                            direct and retroflexion views. Complications:            No immediate complications. Estimated Blood Loss:     Estimated blood loss was minimal. Impression:               - Six diminutive polyps in the transverse colon and                            in the ascending colon, removed with a cold snare.                            Resected and retrieved.                           - The  examination was otherwise normal on direct                            and retroflexion views.                           - Personal history of colonic polyps. 5 diminutive                            adenomas 7/21 Recommendation:           - Patient has a contact number available for                            emergencies. The signs and symptoms of potential                            delayed complications were discussed with the                            patient. Return to normal activities tomorrow.  Written discharge instructions were provided to the                            patient.                           - Resume previous diet.                           - Continue present medications.                           - Await pathology results.                           - Repeat colonoscopy is recommended for                            surveillance. The colonoscopy date will be                            determined after pathology results from today's                            exam become available for review. Iva Boop, MD 03/16/2023 2:10:49 PM This report has been signed electronically.

## 2023-03-16 NOTE — Progress Notes (Signed)
Called to room to assist during endoscopic procedure.  Patient ID and intended procedure confirmed with present staff. Received instructions for my participation in the procedure from the performing physician.  

## 2023-03-16 NOTE — Patient Instructions (Addendum)
I found and removed 6 diminutive polyps - all look like adenomas. I will let you know pathology results and when to have another routine colonoscopy by mail and/or My Chart. Anticipate 3 years, again.  I appreciate the opportunity to care for you. Iva Boop, MD, [facility]  Resume previous diet Continue present medications Await pathology results Handouts/information given for polyps  YOU HAD AN ENDOSCOPIC PROCEDURE TODAY AT THE [facility]:   Refer to the procedure report that was given to you for any specific questions about what was found during the examination.  If the procedure report does not answer your questions, please call your gastroenterologist to clarify.  If you requested that your care partner not be given the details of your procedure findings, then the procedure report has been included in a sealed envelope for you to review at your convenience later.  YOU SHOULD EXPECT: Some feelings of bloating in the abdomen. Passage of more gas than usual.  Walking can help get rid of the air that was put into your GI tract during the procedure and reduce the bloating. If you had a lower endoscopy (such as a colonoscopy or flexible sigmoidoscopy) you may notice spotting of blood in your stool or on the toilet paper. If you underwent a bowel prep for your procedure, you may not have a normal bowel movement for a few days.  Please Note:  You might notice some irritation and congestion in your nose or some drainage.  This is from the oxygen used during your procedure.  There is no need for concern and it should clear up in a day or so.  SYMPTOMS TO REPORT IMMEDIATELY:  Following lower endoscopy (colonoscopy):  Excessive amounts of blood in the stool  Significant tenderness or worsening of abdominal pains  Swelling of the abdomen that is new, acute  Fever of 100F or higher  For urgent or emergent issues, a gastroenterologist can be reached at any hour by calling [phone]  [phone]. Do not use MyChart messaging for urgent concerns.   DIET:  We do recommend a small meal at first, but then you may proceed to your regular diet.  Drink plenty of fluids but you should avoid alcoholic beverages for 24 hours.  ACTIVITY:  You should plan to take it easy for the rest of today and you should NOT DRIVE or use heavy machinery until tomorrow (because of the sedation medicines used during the test).    FOLLOW UP: Our staff will call the number listed on your records the next business day following your procedure.  We will call around 7:15- 8:00 am to check on you and address any questions or concerns that you may have regarding the information given to you following your procedure. If we do not reach you, we will leave a message.     If any biopsies were taken you will be contacted by phone or by letter within the next 1-3 weeks.  Please call us at [phone] if you have not heard about the biopsies in 3 weeks.    SIGNATURES/CONFIDENTIALITY: You and/or your care partner have signed paperwork which will be entered into your electronic medical record.  These signatures attest to the fact that that the information above on your After Visit Summary has been reviewed and is understood.  Full responsibility of the confidentiality of this discharge information lies with you and/or your care-partner.

## 2023-03-16 NOTE — Progress Notes (Signed)
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 Authorizing Provider  citalopram (CELEXA) 40 MG tablet TAKE 1 TABLET BY MOUTH DAILY 06/09/22  Yes   fexofenadine (ALLEGRA ALLERGY) 180 MG tablet 1 tablet   Yes [provider]  fluticasone (FLONASE) 50 MCG/ACT nasal spray Place 1 spray into both nostrils daily.   Yes [provider]  metoprolol tartrate (LOPRESSOR) 25 MG tablet TAKE 1 TABLET BY MOUTH DAILY, THEN INCREASE TO TAKE 1 TABLET BY MOUTH TWO TIMES DAILY AS NEEDED 09/04/20 03/16/23 Yes Perini, Loraine Leriche, MD  pantoprazole (PROTONIX) 40 MG tablet Take 1 tablet (40 mg total) by mouth daily. 12/06/22  Yes   albuterol (VENTOLIN HFA) 108 (90 Base) MCG/ACT inhaler Inhale 1-2 puffs into the lungs every 4-6 hours as needed for cough/wheeze 12/31/21     MAGNESIUM PO Take 500 mg by mouth daily.    [provider]  Multiple Vitamin (MULTIVITAMIN) tablet Take 1 tablet by mouth daily.    [provider]  nitroGLYCERIN (NITROSTAT) 0.4 MG SL tablet Place 1 tablet (0.4 mg total) under the tongue every 5 (five) minutes as needed for chest pain. If you require more than two tablets five minutes apart go to the nearest ER via EMS. 09/23/22 10/23/22  Tolia, Sunit, DO  VITAMIN D PO Take 2,000 Units by  mouth daily.    [provider]    Current Outpatient Medications  Medication Sig Dispense Refill   citalopram (CELEXA) 40 MG tablet TAKE 1 TABLET BY MOUTH DAILY 90 tablet 4   fexofenadine (ALLEGRA ALLERGY) 180 MG tablet 1 tablet     fluticasone (FLONASE) 50 MCG/ACT nasal spray Place 1 spray into both nostrils daily.     metoprolol tartrate (LOPRESSOR) 25 MG tablet TAKE 1 TABLET BY MOUTH DAILY, THEN INCREASE TO TAKE 1 TABLET BY MOUTH TWO TIMES DAILY AS NEEDED 180 tablet 3   pantoprazole (PROTONIX) 40 MG tablet Take 1 tablet (40 mg total) by mouth daily. 90 tablet 4   albuterol (VENTOLIN HFA) 108 (90 Base) MCG/ACT inhaler Inhale 1-2 puffs into the lungs every 4-6 hours as needed for cough/wheeze 18 g 0   MAGNESIUM PO Take 500 mg by mouth daily.     Multiple Vitamin (MULTIVITAMIN) tablet Take 1 tablet by mouth daily.     nitroGLYCERIN (NITROSTAT) 0.4 MG SL tablet Place 1 tablet (0.4 mg total) under the tongue every 5 (five) minutes as needed for chest pain. If you require more than two tablets five minutes apart go to the nearest ER via EMS. 25 tablet 0   VITAMIN D PO Take 2,000 Units by mouth daily.  Current Facility-Administered Medications  Medication Dose Route Frequency Provider Last Rate Last Admin   0.9 %  sodium chloride infusion  500 mL Intravenous Once Iva Boop, MD        Allergies as of 03/16/2023 - Review Complete 03/16/2023  Allergen Reaction Noted   Amoxicillin Hives 12/24/2019   Cephalosporins Hives 12/24/2019   Cephalosporins Other (See Comments) 12/22/2020   Penicillins Other (See Comments) 12/22/2020    Family History  Problem Relation Age of Onset   Diabetes Mother    Other Mother        Trigeminal Neuralgia, Meningioma (removal 1990s)   Hypertension Mother    Migraines Mother    Sleep apnea Mother    Crohn's disease Mother    Asthma Mother    Colon polyps Mother    Heart disease Father    Asthma Brother    Sleep apnea Brother    Healthy  Brother    Colon cancer Maternal Grandmother    Heart disease Paternal Grandfather    Prostate cancer Paternal Grandfather    Esophageal cancer Neg Hx    Stomach cancer Neg Hx    Rectal cancer Neg Hx     Social History   Socioeconomic History   Marital status: Married    Spouse name: Not on file   Number of children: 2   Years of education: college   Highest education level: Professional school degree (e.g., MD, DDS, DVM, JD)  Occupational History   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   Right-handed.   1 cup caffeine per day.   Social Determinants of Health   Financial Resource Strain: Not on file  Food Insecurity: Not on file  Transportation Needs: Not on file  Physical Activity: Not on file  Stress: Not on file  Social Connections: Not on file  Intimate Partner Violence: Not on file    Review of Systems:  All other review of systems negative except as mentioned in the HPI.  Physical Exam: Vital signs BP (!) 141/87   Pulse 68   Temp 97.7 F (36.5 C)   Ht 6\' 1"  (1.854 m)   Wt 210 lb (95.3 kg)   SpO2 99%   BMI 27.71 kg/m   General:   Alert,  Well-developed, well-nourished, pleasant and cooperative in NAD Lungs:  Clear throughout to auscultation.   Heart:  Regular rate and rhythm; no murmurs, clicks, rubs,  or gallops. Abdomen:  Soft, nontender and nondistended. Normal bowel sounds.   Neuro/Psych:  Alert and cooperative. Normal mood and affect. A and O x 3   @Andres Vest  Sena Slate, MD, [facility] [phone] (pager) 03/16/2023 1:08 PM@

## 2023-03-18 ENCOUNTER — Telehealth: Payer: Self-pay | Admitting: *Deleted

## 2023-03-18 NOTE — Telephone Encounter (Signed)
  Follow up Call-     03/16/2023   12:56 PM  Call back number  Post procedure Call Back phone  # [phone]  Permission to leave phone message Yes   Benjamin [facility]

## 2023-03-28 ENCOUNTER — Encounter: Payer: Self-pay | Admitting: Internal Medicine

## 2023-03-28 DIAGNOSIS — Z86010 Personal history of colonic polyps: Secondary | ICD-10-CM

## 2023-04-06 DIAGNOSIS — G4733 Obstructive sleep apnea (adult) (pediatric): Secondary | ICD-10-CM | POA: Diagnosis not present

## 2023-04-08 ENCOUNTER — Other Ambulatory Visit (HOSPITAL_COMMUNITY): Payer: Self-pay

## 2023-04-08 ENCOUNTER — Other Ambulatory Visit: Payer: Self-pay

## 2023-04-08 MED ORDER — METOPROLOL TARTRATE 25 MG PO TABS
25.0000 mg | ORAL_TABLET | Freq: Two times a day (BID) | ORAL | 4 refills | Status: DC | PRN
  Filled 2023-04-08: qty 180, 90d supply, fill #0
  Filled 2023-10-12: qty 180, 90d supply, fill #1
  Filled 2024-04-05: qty 180, 90d supply, fill #2

## 2023-04-12 ENCOUNTER — Telehealth (INDEPENDENT_AMBULATORY_CARE_PROVIDER_SITE_OTHER): Payer: BC Managed Care – PPO | Admitting: Neurology

## 2023-04-12 ENCOUNTER — Encounter: Payer: Self-pay | Admitting: Neurology

## 2023-04-12 DIAGNOSIS — R072 Precordial pain: Secondary | ICD-10-CM

## 2023-04-12 DIAGNOSIS — G4733 Obstructive sleep apnea (adult) (pediatric): Secondary | ICD-10-CM

## 2023-04-12 DIAGNOSIS — R0683 Snoring: Secondary | ICD-10-CM

## 2023-04-12 NOTE — Progress Notes (Signed)
Virtual Visit via Video Note  I connected with Benjamin Low, MD on 04/12/23 at 10:30 AM EDT by a video enabled telemedicine application and verified that I am speaking with the correct person using two identifiers.   CC Dr Rodrigo Ran and Dr Odis Hollingshead .   Location: Patient: at his office at home  Provider: at her office   I discussed the limitations of evaluation and management by telemedicine and the availability of in person appointments. The patient expressed understanding and agreed to proceed.  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  6.8/h and was some REM exacerbation but no REM sleep dependence.  There was no unusual heart rate variability nor was there sleep hypoxemia.    And I considered CPAP to be optional but could have also been a dental device used however to see an effect of apnea treatment on the patient's wellbeing would be quickest achieved with CPAP.  There was also an RDI of 8.9 which indicates at least moderate if not loud snoring.  He has not followed up after 3 months of being on CPAP and has been a 93% compliant user.  The average user time is 7 hours 12 minutes the minimum pressure is set at 5 the maximum pressure of 12 cm CPAP with an expiratory pressure relief of 3 cm allowing for easier exhalation.  His residual AHI is 3.1 and is almost equally divided between central and obstructive apneas which is expected in the treatment of mild apnea.  The 95th percentile pressure is 9.6 cmH2O and straddles the upper limit of 12 cm x 3 cm relief.  The patient may do even better with 1 or 2 cm more maximum pressure.  This is an automatic Pap titration which means that if no apneas occur there is no need for the machine to use the higher pressure settings.  He should not have the feeling of swallowing air for getting sinus pressure.   He feels overall much better and he also stated  that his wife sleeps better now that his snoring and apneas are gone his fatigue severity score was endorsed at 23 points and Epworth sleepiness score at 5 points which is a great improvement.  He feels less affected by allergies, his nasal passages have remained clear .  04-12-2023:   Mild OSA on CPAP, 100% compliance and using the machine over 8 hours, and he sleeps these hours, usually restful, not tossing and turning a lot.   Residual AHI is 1.3/h and his settings are 5-14 cm  and EPR 3 cm . 95% pressure is at 9.3 cm water , no airleak !!!. No significant weight loss in the meantime.   Some RLS on some nights. Estimated at once a week .     Observations/Objective: Epworth Sleepiness score 6/ 24 points,. FSS not filled.    Assessment and Plan: overall,  this is a positive outcome of apnea treatment.  No airleaks! ou will stay on the same  settings and with the same mask-  nasal pillow interface.  Wife is very happy with his sleep, lack of snoring.    Follow Up Instructions: in one year with NP.   In 2026, his machine will be approaching its 5 year age mark and we will obtain a HST baseline again.     I discussed the assessment and treatment plan with the patient. The  patient was provided an opportunity to ask questions and all were answered. The patient agreed with the plan and demonstrated an understanding of the instructions.   The patient was advised to call back or seek an in-person evaluation if the symptoms worsen or if the condition fails to improve as anticipated.  I provided 12 minutes of non-face-to-face time during this encounter.   Melvyn Novas, MD

## 2023-04-12 NOTE — Patient Instructions (Signed)

## 2023-05-07 DIAGNOSIS — G4733 Obstructive sleep apnea (adult) (pediatric): Secondary | ICD-10-CM | POA: Diagnosis not present

## 2023-06-13 ENCOUNTER — Other Ambulatory Visit (HOSPITAL_COMMUNITY): Payer: Self-pay

## 2023-06-15 DIAGNOSIS — G4733 Obstructive sleep apnea (adult) (pediatric): Secondary | ICD-10-CM | POA: Diagnosis not present

## 2023-06-28 ENCOUNTER — Other Ambulatory Visit (HOSPITAL_COMMUNITY): Payer: Self-pay

## 2023-07-16 DIAGNOSIS — G4733 Obstructive sleep apnea (adult) (pediatric): Secondary | ICD-10-CM | POA: Diagnosis not present

## 2023-07-22 ENCOUNTER — Other Ambulatory Visit (HOSPITAL_COMMUNITY): Payer: Self-pay

## 2023-07-22 MED ORDER — PREDNISONE 5 MG (21) PO TBPK
ORAL_TABLET | ORAL | 0 refills | Status: AC
  Filled 2023-07-22: qty 21, 6d supply, fill #0

## 2023-07-22 MED ORDER — LAGEVRIO 200 MG PO CAPS
4.0000 | ORAL_CAPSULE | Freq: Two times a day (BID) | ORAL | 0 refills | Status: AC
  Filled 2023-07-22: qty 40, 5d supply, fill #0

## 2023-07-22 MED ORDER — PAXLOVID (300/100) 20 X 150 MG & 10 X 100MG PO TBPK: 3.0000 | ORAL_TABLET | Freq: Two times a day (BID) | ORAL | 0 refills | Status: AC

## 2023-07-28 ENCOUNTER — Other Ambulatory Visit (HOSPITAL_COMMUNITY): Payer: Self-pay

## 2023-07-28 DIAGNOSIS — G43109 Migraine with aura, not intractable, without status migrainosus: Secondary | ICD-10-CM | POA: Diagnosis not present

## 2023-07-28 DIAGNOSIS — Z125 Encounter for screening for malignant neoplasm of prostate: Secondary | ICD-10-CM | POA: Diagnosis not present

## 2023-07-28 DIAGNOSIS — R7301 Impaired fasting glucose: Secondary | ICD-10-CM | POA: Diagnosis not present

## 2023-07-28 DIAGNOSIS — E785 Hyperlipidemia, unspecified: Secondary | ICD-10-CM | POA: Diagnosis not present

## 2023-07-28 DIAGNOSIS — Z0000 Encounter for general adult medical examination without abnormal findings: Secondary | ICD-10-CM | POA: Diagnosis not present

## 2023-07-28 MED ORDER — BUDESONIDE-FORMOTEROL FUMARATE 160-4.5 MCG/ACT IN AERO
2.0000 | INHALATION_SPRAY | Freq: Two times a day (BID) | RESPIRATORY_TRACT | 11 refills | Status: AC
  Filled 2023-07-28: qty 10.2, 30d supply, fill #0
  Filled 2023-09-04: qty 10.2, 30d supply, fill #1
  Filled 2023-12-09: qty 10.2, 30d supply, fill #2
  Filled 2024-03-13: qty 10.2, 30d supply, fill #3

## 2023-09-04 ENCOUNTER — Other Ambulatory Visit (HOSPITAL_COMMUNITY): Payer: Self-pay

## 2023-09-05 ENCOUNTER — Other Ambulatory Visit (HOSPITAL_COMMUNITY): Payer: Self-pay

## 2023-09-05 ENCOUNTER — Other Ambulatory Visit (HOSPITAL_BASED_OUTPATIENT_CLINIC_OR_DEPARTMENT_OTHER): Payer: Self-pay

## 2023-09-05 ENCOUNTER — Other Ambulatory Visit: Payer: Self-pay

## 2023-09-05 MED ORDER — CITALOPRAM HYDROBROMIDE 40 MG PO TABS
40.0000 mg | ORAL_TABLET | Freq: Every day | ORAL | 3 refills | Status: DC
  Filled 2023-09-05: qty 90, 90d supply, fill #0
  Filled 2023-12-09: qty 90, 90d supply, fill #1
  Filled 2024-03-13: qty 90, 90d supply, fill #2
  Filled 2024-06-07: qty 90, 90d supply, fill #3

## 2023-09-05 MED ORDER — ALBUTEROL SULFATE HFA 108 (90 BASE) MCG/ACT IN AERS
1.0000 | INHALATION_SPRAY | RESPIRATORY_TRACT | 0 refills | Status: AC | PRN
  Filled 2023-09-05: qty 18, 17d supply, fill #0

## 2023-10-04 DIAGNOSIS — G4733 Obstructive sleep apnea (adult) (pediatric): Secondary | ICD-10-CM | POA: Diagnosis not present

## 2023-10-07 DIAGNOSIS — J3089 Other allergic rhinitis: Secondary | ICD-10-CM | POA: Diagnosis not present

## 2023-12-09 ENCOUNTER — Other Ambulatory Visit: Payer: Self-pay

## 2023-12-09 ENCOUNTER — Other Ambulatory Visit (HOSPITAL_COMMUNITY): Payer: Self-pay

## 2023-12-09 MED ORDER — PANTOPRAZOLE SODIUM 40 MG PO TBEC
40.0000 mg | DELAYED_RELEASE_TABLET | Freq: Every day | ORAL | 4 refills | Status: AC
  Filled 2023-12-09: qty 90, 90d supply, fill #0
  Filled 2024-03-13: qty 90, 90d supply, fill #1
  Filled 2024-06-07: qty 90, 90d supply, fill #2
  Filled 2024-09-03: qty 90, 90d supply, fill #3

## 2024-01-27 DIAGNOSIS — H1045 Other chronic allergic conjunctivitis: Secondary | ICD-10-CM | POA: Diagnosis not present

## 2024-01-27 DIAGNOSIS — J4530 Mild persistent asthma, uncomplicated: Secondary | ICD-10-CM | POA: Diagnosis not present

## 2024-01-27 DIAGNOSIS — J3089 Other allergic rhinitis: Secondary | ICD-10-CM | POA: Diagnosis not present

## 2024-02-04 DIAGNOSIS — G4733 Obstructive sleep apnea (adult) (pediatric): Secondary | ICD-10-CM | POA: Diagnosis not present

## 2024-03-06 DIAGNOSIS — G4733 Obstructive sleep apnea (adult) (pediatric): Secondary | ICD-10-CM | POA: Diagnosis not present

## 2024-03-13 ENCOUNTER — Other Ambulatory Visit: Payer: Self-pay

## 2024-04-05 ENCOUNTER — Encounter (HOSPITAL_COMMUNITY): Payer: Self-pay

## 2024-04-05 ENCOUNTER — Encounter: Payer: Self-pay | Admitting: Neurology

## 2024-04-06 ENCOUNTER — Other Ambulatory Visit (HOSPITAL_COMMUNITY): Payer: Self-pay

## 2024-04-10 DIAGNOSIS — G4733 Obstructive sleep apnea (adult) (pediatric): Secondary | ICD-10-CM | POA: Diagnosis not present

## 2024-04-24 DIAGNOSIS — J3089 Other allergic rhinitis: Secondary | ICD-10-CM | POA: Diagnosis not present

## 2024-05-11 DIAGNOSIS — G4733 Obstructive sleep apnea (adult) (pediatric): Secondary | ICD-10-CM | POA: Diagnosis not present

## 2024-06-11 DIAGNOSIS — G4733 Obstructive sleep apnea (adult) (pediatric): Secondary | ICD-10-CM | POA: Diagnosis not present

## 2024-06-22 ENCOUNTER — Other Ambulatory Visit (HOSPITAL_COMMUNITY): Payer: Self-pay

## 2024-06-22 MED ORDER — OPTICHAMBER DIAMOND-LG MASK DEVI
1.0000 | Freq: Once | 1 refills | Status: AC
  Filled 2024-06-22: qty 1, 30d supply, fill #0

## 2024-08-13 ENCOUNTER — Other Ambulatory Visit (HOSPITAL_COMMUNITY): Payer: Self-pay

## 2024-08-17 NOTE — Progress Notes (Unsigned)
 Benjamin Rivera

## 2024-08-19 ENCOUNTER — Other Ambulatory Visit (HOSPITAL_COMMUNITY): Payer: Self-pay

## 2024-08-20 ENCOUNTER — Ambulatory Visit: Admitting: Neurology

## 2024-08-20 ENCOUNTER — Encounter: Payer: Self-pay | Admitting: Neurology

## 2024-08-20 ENCOUNTER — Other Ambulatory Visit (HOSPITAL_COMMUNITY): Payer: Self-pay

## 2024-08-20 VITALS — BP 122/72 | HR 67 | Ht 73.0 in | Wt 213.0 lb

## 2024-08-20 DIAGNOSIS — R072 Precordial pain: Secondary | ICD-10-CM | POA: Diagnosis not present

## 2024-08-20 DIAGNOSIS — R0683 Snoring: Secondary | ICD-10-CM | POA: Diagnosis not present

## 2024-08-20 DIAGNOSIS — G4733 Obstructive sleep apnea (adult) (pediatric): Secondary | ICD-10-CM | POA: Diagnosis not present

## 2024-08-20 MED ORDER — METOPROLOL TARTRATE 25 MG PO TABS
25.0000 mg | ORAL_TABLET | Freq: Two times a day (BID) | ORAL | 3 refills | Status: AC | PRN
  Filled 2024-08-20: qty 180, 90d supply, fill #0

## 2024-08-20 NOTE — Progress Notes (Signed)
 Community message has been sent to [employer] for pressure and supplies on 08/20/24. DD

## 2024-08-20 NOTE — Progress Notes (Signed)
 Provider:  Dedra Gores, MD  Primary Care Physician:  Shayne Anes, MD [address] 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 /occurrences. .    Chief concern according to patient :  None.  The nasal interface is wearing off , the headgear is flimsier as the age of the mask increases.  He is not playing as much Tennis anymore, but walks the dog. He is  highly compliant and presents with a residual AHI of < 2/h.  Married, MD, Children are 74 and 21 now.    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  6.8/h and was some REM exacerbation but not REM sleep dependency.   There was no unusual cardiac variability by rate nor was there sleep hypoxemia.  And I considered CPAP to be optional but could have also been a dental device used however to see an effect of apnea treatment on the patient's wellbeing would be quickest achieved with CPAP.  There was also an RDI of 8.9 which indicates at least moderate if not loud snoring.  He has not followed up after 3 months of being on CPAP and has been a 93% compliant user.   The average user time is 7 hours 12 minutes the minimum pressure is set at 5 the maximum pressure of 12 cm CPAP with an expiratory pressure relief of 3 cm allowing for easier exhalation.   His residual AHI is 3.1/h and is almost equally divided between central and obstructive apneas which is expected in the treatment of mild apnea.  The 95th percentile pressure is 9.6 cmH2O and  straddles the upper limit of 12 cm x 3 cm relief.  The patient may do even better with 1 or 2 cm more maximum pressure.  This is an automatic Pap titration which means that if no apneas occur there is no need for the machine to use the higher pressure settings.  He should not have the feeling of swallowing air for getting sinus pressure.  He feels overall much better and he also stated that his wife sleeps better now that his snoring and apneas are gone his fatigue severity score was endorsed at 23 points and Epworth sleepiness score at 5 points which is a great improvement.  He feels less affected by allergies, his nasal passages have remained clear .  Overall, we will part today with a oxygen of a 2 cm increased in pressure and a refill for his supplies: mask tubing etc.         CONSULTATION :  Chief concern according to patient  I just don't sleep enough and I snore.   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  He  has a past medical history of  Allergies, Allergy, Anxiety, Asthma, GERD (gastroesophageal reflux disease), Headache, adenomatous colonic polyps (03/25/2020), Migraine, and Sleep apnea. Asthma is an allergic manifestation for him. GERD is an active problem. Dr. 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  Hypersomnia, mild  Daytime sleepiness, non restorative sleep, need for naps. 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     Sleep relevant medical history: wisdom teeth removed.Family medical /sleep history: Mother and brother used CPAP for OSA. Social history: Patient is working as a herbalist, [employer].  and lives in a household with wife and 2 children, son is freshman in college, daughter is 76.  The patient currently works and takes call as MD. Pets are present- one dog. Tobacco use: none .  ETOH use; 2-3 times a week,  Caffeine intake in form of  Coffee( 1 in AM  Soda( none ) Tea ( none) nor energy drinks.this has decreased since being on CPAP.  Regular exercise in form of walking 3 miles a day, and playing tennis.     Review of Systems: Out of a complete 14 system review, the patient complains of only the following symptoms, and all other reviewed systems are negative.:   SLEEPINESS ?  How likely are you to doze in the following situations: 0 = not likely, 1 = slight chance, 2 = moderate chance, 3 = high chance  Sitting and Reading? Watching Television? Sitting inactive in a public place (theater or meeting)? Lying down in the afternoon when circumstances permit? Sitting and talking to someone? Sitting quietly after lunch without alcohol ? In a car, while stopped for a few minutes in traffic? As a passenger in a car for an hour without a break?  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   Right-handed.   1 cup caffeine per day.   Social Drivers of Corporate Investment Banker Strain: Not on file  Food Insecurity: Not on file  Transportation Needs: Not on file  Physical Activity: Not on file  Stress: Not on file  Social Connections: Not on file    Family History   Problem Relation Age of Onset   Diabetes Mother    Other Mother        Trigeminal Neuralgia, Meningioma (removal 1990s)   Hypertension Mother    Migraines Mother    Sleep apnea Mother    Crohn's disease Mother    Asthma Mother    Colon polyps Mother    Heart disease Father    Asthma Brother  Sleep apnea Brother    Healthy Brother    Colon cancer Maternal Grandmother    Heart disease Paternal Grandfather    Prostate cancer Paternal Grandfather    Esophageal cancer Neg Hx    Stomach cancer Neg Hx    Rectal cancer Neg Hx     Past Medical History:  Diagnosis Date   Allergies    Allergy    Anxiety    Asthma    GERD (gastroesophageal reflux disease)    Headache    Hx of adenomatous colonic polyps 03/25/2020   Migraine    Sleep apnea    not currently using cpap    Past Surgical History:  Procedure Laterality Date   COLONOSCOPY W/ POLYPECTOMY  03/2020   shoulder surgery  2012   VEIN SURGERY  2003   WISDOM TOOTH EXTRACTION  2001     Current Outpatient Medications on File Prior to Visit  Medication Sig Dispense Refill   albuterol  (VENTOLIN  HFA) 108 (90 Base) MCG/ACT inhaler Inhale 1-2 puffs into the lungs every 4 - 6 hours as needed for cough/wheeze. 18 g 0   budesonide -formoterol  (SYMBICORT ) 160-4.5 MCG/ACT inhaler Inhale 2 puffs into the lungs 2 (two) times daily. 10.2 g 11   citalopram  (CELEXA ) 40 MG tablet Take 1 tablet (40 mg total) by mouth daily. 90 tablet 3   fexofenadine (ALLEGRA ALLERGY) 180 MG tablet 1 tablet     fluticasone (FLONASE) 50 MCG/ACT nasal spray Place 1 spray into both nostrils daily.     MAGNESIUM PO Take 500 mg by mouth daily.     Multiple Vitamin (MULTIVITAMIN) tablet Take 1 tablet by mouth daily.     pantoprazole  (PROTONIX ) 40 MG tablet Take 1 tablet (40 mg total) by mouth daily. 90 tablet 4   VITAMIN D PO Take 2,000 Units by mouth daily.     metoprolol  tartrate (LOPRESSOR ) 25 MG tablet TAKE 1 TABLET BY MOUTH DAILY, THEN INCREASE TO TAKE 1  TABLET BY MOUTH TWO TIMES DAILY AS NEEDED 180 tablet 3   molnupiravir  EUA (LAGEVRIO ) 200 MG CAPS capsule Take 4 capsules (800 mg total) by mouth every 12 (twelve) hours for 5 days 40 capsule 0   nirmatrelvir/ritonavir (PAXLOVID , 300/100,) 20 x 150 MG & 10 x 100MG  TBPK Take 3 tablets by mouth 2 (two) times daily for 5 days 30 tablet 0   nitroGLYCERIN  (NITROSTAT ) 0.4 MG SL tablet Place 1 tablet (0.4 mg total) under the tongue every 5 (five) minutes as needed for chest pain. If you require more than two tablets five minutes apart go to the nearest ER via EMS. 25 tablet 0   predniSONE  (STERAPRED UNI-PAK 21 TAB) 5 MG (21) TBPK tablet Take as directed 21 tablet 0   No current facility-administered medications on file prior to visit.    Allergies  Allergen Reactions   Amoxicillin Hives   Cephalosporins Hives   Cephalosporins Other (See Comments)   Penicillins Other (See Comments)     DIAGNOSTIC DATA (LABS, IMAGING, TESTING) - I reviewed patient records, labs, notes, testing and imaging myself where available.  No results found for: WBC, HGB, HCT, MCV, PLT    Component Value Date/Time   NA 137 10/01/2022 1439   K 4.6 10/01/2022 1439   CL 98 10/01/2022 1439   CO2 23 10/01/2022 1439   GLUCOSE 90 10/01/2022 1439   BUN 14 10/01/2022 1439   CREATININE 0.92 10/01/2022 1439   CALCIUM 9.3 10/01/2022 1439   No results found for: CHOL, HDL,  LDLCALC, LDLDIRECT, TRIG, CHOLHDL No results found for: YHAJ8R No results found for: VITAMINB12 No results found for: TSH  PHYSICAL EXAM:  Vitals:   08/20/24 1326  BP: 122/72  Pulse: 67   No data found. Body mass index is 28.1 kg/m.   Wt Readings from Last 3 Encounters:  08/20/24 213 lb (96.6 kg)  03/16/23 210 lb (95.3 kg)  02/18/23 210 lb (95.3 kg)     Ht Readings from Last 3 Encounters:  08/20/24 6' 1 (1.854 m)  03/16/23 6' 1 (1.854 m)  02/18/23 6' 1 (1.854 m)      General: The patient is awake, alert and  appears not in acute distress and groomed. Head: Normocephalic, atraumatic.  Neck is supple. General: The patient is awake, alert and appears not in acute distress. The patient is well groomed. Head: Normocephalic, atraumatic. Neck is supple. Mallampati 1, red soft palate.  neck circumference: 16.00 inches . BMI 27.  Nasal airflow patent.  Retrognathia is seen.  Dental status: intact . Intact- Lower jaw.  Cardiovascular:  Regular rate and cardiac rhythm by pulse,  without distended neck veins. Respiratory: Lungs are clear to auscultation.  Skin:  Without evidence of ankle edema, or rash. Trunk: The patient's posture is erect.   Neurologic exam : The patient is awake and alert, oriented to place and time.   Memory subjective described as intact.  Attention span & concentration ability appears normal.  Speech is fluent, appropriate.   Cranial nerves: no loss of smell or taste reported - vaccinated .  Pupils are equal and briskly reactive to light. Funduscopic exam deferred.  Wears glasses.  Extraocular movements in vertical and horizontal planes were intact and with horizontal endpoint  nystagmus. Facial motor strength is symmetric - tongue and uvula move midline.  Neck ROM : rotation, tilt and flexion extension were normal for age and shoulder shrug was symmetrical.       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  Reports increasing air leak as the age of the mask increases.   2) Plan HST baseline,  and obtain a new baseline.  RV on new machine can be in 6 months.   Uses nasal pillows , at 5-14 cm water, 3 cm EPR.     I would like to thank Shayne Anes, MD , for allowing me to meet with this pleasant patient.   Sleep Clinic Patients are generally offered input on sleep hygiene, life style changes and how to improve compliance  with medical treatment where applicable. Review and reiteration of good sleep hygiene measures is offered to any sleep clinic patient, be it in the first consultation or with any follow up visits.  Any patient with sleepiness should be cautioned not to drive, work at heights, or operate dangerous or heavy equipment when feeling tired or sleepy.  The patient will be seen in follow-up in the sleep clinic at [facility] for discussion of test results, sleep related symptoms and treatment compliance review, further management strategies, etc.   The referring provider will be notified of the test results.   The patient's condition requires frequent monitoring and adjustments in the treatment plan, reflecting the ongoing complexity of care.  This provider is the continuing focal point for all needed services for this condition.  After spending a total time of  24  minutes face to face and time for  history taking, physical and neurologic examination, review of laboratory studies,  personal review of imaging studies, reports and results of other testing and review of referral information / records as far as provided in visit,   Electronically signed by: Dedra Gores, MD 08/20/2024 1:46 PM  [facility] Board certified by The [employer] of Sleep Medicine and Diplomate of the [employer] of [employer] Medicine. Board certified In Neurology through the ABPN, Fellow of the [employer] of Neu[employer].

## 2024-09-03 ENCOUNTER — Other Ambulatory Visit: Payer: Self-pay

## 2024-09-03 ENCOUNTER — Other Ambulatory Visit (HOSPITAL_COMMUNITY): Payer: Self-pay

## 2024-09-03 ENCOUNTER — Telehealth: Payer: Self-pay | Admitting: Neurology

## 2024-09-03 NOTE — Telephone Encounter (Signed)
 HST BCBS pending

## 2024-09-04 ENCOUNTER — Other Ambulatory Visit (HOSPITAL_COMMUNITY): Payer: Self-pay

## 2024-09-04 ENCOUNTER — Telehealth: Payer: Self-pay | Admitting: Neurology

## 2024-09-04 MED ORDER — CITALOPRAM HYDROBROMIDE 40 MG PO TABS
40.0000 mg | ORAL_TABLET | Freq: Every day | ORAL | 3 refills | Status: AC
  Filled 2024-09-04: qty 90, 90d supply, fill #0

## 2024-09-04 NOTE — Telephone Encounter (Signed)
 [facility] called to request updated order for PT Cpap Machines . [name] stated they need order to say the actual pressure setting   on order . So they will know what  to set is as.  Callback  [phone] Fax [phone]

## 2024-09-05 ENCOUNTER — Other Ambulatory Visit (HOSPITAL_COMMUNITY): Payer: Self-pay

## 2024-09-05 NOTE — Addendum Note (Signed)
 Addended byBETHA SAMUEL, Ellsie Violette  D on: 09/05/2024 09:27 AM   Modules accepted: Orders

## 2024-09-05 NOTE — Telephone Encounter (Addendum)
 Addend order and added pressure settings from previous order to continue until new SS is complete.

## 2024-09-11 NOTE — Addendum Note (Signed)
 Addended by: CHALICE SAUNAS on: 09/11/2024 09:19 PM   Modules accepted: Orders

## 2024-09-17 ENCOUNTER — Other Ambulatory Visit: Payer: Self-pay | Admitting: Neurology

## 2024-09-17 NOTE — Telephone Encounter (Signed)
 Looks like order was placed today and sent to DME by [facility] .

## 2024-09-17 NOTE — Addendum Note (Signed)
 Addended by: ROBBERT, Keats Kingry  D on: 09/17/2024 09:58 AM   Modules accepted: Orders

## 2024-09-17 NOTE — Telephone Encounter (Addendum)
 Ronda Kazmi  D, CMA  New, Bradley; Tucker, Dolanda; Cain, Mitchell; Ziegler, Melissa New orders have been placed for the above pt, DOB: 07/06/2071 Thanks    RE: pressure change Received: Today New, Adine Leer, Bradley; Robbert, Briant Angelillo  D, CMA; Tucker, Dolanda; Sheree, Mitchell; Ziegler, Melissa Hello Sayer Masini ,  FYI: per notes patient received cpap unit on 03/20/2020 and is not eligable for a new pap unit at this time.  I will send the order in for the pressure settings. I see notes in the account that makes it sound like this order is still for a new pap unit and watned to make you aware ( just in case).  He should be elig. for new pap unit on 03/20/2025 ( 5 years).  Thank you,  Arvella Leer

## 2024-09-17 NOTE — Telephone Encounter (Signed)
 [employer] [place] denied the HST it appears that you can order a new machine.
